# Patient Record
Sex: Female | Born: 1960 | Race: Black or African American | Hispanic: No | Marital: Married | Smoking: Never smoker
Health system: Southern US, Community
[De-identification: ages and names within clinical notes are randomized; demographics above are authoritative.]

## PROBLEM LIST (undated history)

## (undated) DIAGNOSIS — G43909 Migraine, unspecified, not intractable, without status migrainosus: Secondary | ICD-10-CM

## (undated) DIAGNOSIS — K219 Gastro-esophageal reflux disease without esophagitis: Secondary | ICD-10-CM

## (undated) DIAGNOSIS — N200 Calculus of kidney: Secondary | ICD-10-CM

## (undated) DIAGNOSIS — E7800 Pure hypercholesterolemia, unspecified: Secondary | ICD-10-CM

## (undated) DIAGNOSIS — M797 Fibromyalgia: Secondary | ICD-10-CM

## (undated) DIAGNOSIS — M1990 Unspecified osteoarthritis, unspecified site: Secondary | ICD-10-CM

## (undated) DIAGNOSIS — Z8739 Personal history of other diseases of the musculoskeletal system and connective tissue: Secondary | ICD-10-CM

## (undated) HISTORY — PX: TUBAL LIGATION: SHX77

## (undated) HISTORY — PX: ABDOMINAL HYSTERECTOMY: SHX81

## (undated) HISTORY — PX: BREAST CYST EXCISION: SHX579

## (undated) HISTORY — PX: LAPAROSCOPIC GASTRIC SLEEVE RESECTION: SHX5895

## (undated) HISTORY — PX: OTHER SURGICAL HISTORY: SHX169

---

## 2001-03-24 ENCOUNTER — Emergency Department (HOSPITAL_COMMUNITY): Admission: EM | Admit: 2001-03-24 | Discharge: 2001-03-24 | Payer: Self-pay | Admitting: Emergency Medicine

## 2012-06-12 ENCOUNTER — Emergency Department (HOSPITAL_BASED_OUTPATIENT_CLINIC_OR_DEPARTMENT_OTHER)
Admission: EM | Admit: 2012-06-12 | Discharge: 2012-06-12 | Disposition: A | Discharge status: Home / Self Care | Payer: BC Managed Care – PPO | Attending: Emergency Medicine | Admitting: Emergency Medicine

## 2012-06-12 ENCOUNTER — Encounter (HOSPITAL_BASED_OUTPATIENT_CLINIC_OR_DEPARTMENT_OTHER): Payer: Self-pay | Admitting: *Deleted

## 2012-06-12 DIAGNOSIS — M129 Arthropathy, unspecified: Secondary | ICD-10-CM | POA: Insufficient documentation

## 2012-06-12 DIAGNOSIS — K219 Gastro-esophageal reflux disease without esophagitis: Secondary | ICD-10-CM | POA: Insufficient documentation

## 2012-06-12 DIAGNOSIS — L089 Local infection of the skin and subcutaneous tissue, unspecified: Secondary | ICD-10-CM

## 2012-06-12 DIAGNOSIS — R21 Rash and other nonspecific skin eruption: Secondary | ICD-10-CM

## 2012-06-12 DIAGNOSIS — E7800 Pure hypercholesterolemia, unspecified: Secondary | ICD-10-CM | POA: Insufficient documentation

## 2012-06-12 HISTORY — DX: Pure hypercholesterolemia, unspecified: E78.00

## 2012-06-12 HISTORY — DX: Gastro-esophageal reflux disease without esophagitis: K21.9

## 2012-06-12 HISTORY — DX: Unspecified osteoarthritis, unspecified site: M19.90

## 2012-06-12 MED ORDER — SULFAMETHOXAZOLE-TRIMETHOPRIM 800-160 MG PO TABS: 1.0000 | ORAL_TABLET | Freq: Two times a day (BID) | ORAL | Status: AC

## 2012-06-12 MED ORDER — PREDNISONE 10 MG PO TABS: 20.0000 mg | ORAL_TABLET | Freq: Every day | ORAL | Status: DC

## 2012-06-12 MED ORDER — FLUCONAZOLE 150 MG PO TABS: 150.0000 mg | ORAL_TABLET | Freq: Once | ORAL | Status: AC

## 2012-06-12 NOTE — ED Provider Notes (Signed)
History     CSN: [id number]  Arrival date & time 06/12/12  1234   First MD Initiated Contact with Patient 06/12/12 1302      Chief Complaint  Patient presents with  . Pruritis    (Consider location/radiation/quality/duration/timing/severity/associated sxs/prior treatment) Patient is a 51 y.o. female presenting with rash. The history is provided by the patient. No language interpreter was used.  Rash  This is a new problem. The current episode started 2 days ago. The problem has been gradually worsening. The problem is associated with nothing. There has been no fever. The pain is at a severity of 4/10. The pain is moderate. The pain has been constant since onset. Associated symptoms include itching. She has tried nothing for the symptoms. The treatment provided no relief.  Pt complains of a rash to her face.    Past Medical History  Diagnosis Date  . Hypercholesteremia   . Arthritis   . GERD (gastroesophageal reflux disease)     Past Surgical History  Procedure Date  . Abdominal hysterectomy   . Tubal ligation   . Breast cyst excision     No family history on file.  History  Substance Use Topics  . Smoking status: Never Smoker   . Smokeless tobacco: Not on file  . Alcohol Use: No    OB History    Grav Para Term Preterm Abortions TAB SAB Ect Mult Living                  Review of Systems  Skin: Positive for itching and rash.  All other systems reviewed and are negative.    Allergies  Tetracyclines & related  Home Medications   Current Outpatient Rx  Name Route Sig Dispense Refill  . CELECOXIB 100 MG PO CAPS Oral Take 100 mg by mouth 2 (two) times daily.    Marland Kitchen ESOMEPRAZOLE MAGNESIUM 10 MG PO PACK Oral Take 10 mg by mouth daily before breakfast.    . GABAPENTIN 100 MG PO CAPS Oral Take 100 mg by mouth 3 (three) times daily.    Marland Kitchen MONTELUKAST SODIUM 10 MG PO TABS Oral Take 10 mg by mouth at bedtime.    Marland Kitchen SIMVASTATIN 10 MG PO TABS Oral Take 10 mg by mouth at  bedtime.      BP 165/78  Pulse 51  Temp 98.6 F (37 C) (Oral)  Resp 18  SpO2 100%  Physical Exam  Nursing note and vitals reviewed. Constitutional: She is oriented to person, place, and time. She appears well-developed and well-nourished.  HENT:  Head: Normocephalic and atraumatic.  Right Ear: External ear normal.  Left Ear: External ear normal.  Eyes: Conjunctivae are normal. Pupils are equal, round, and reactive to light.  Cardiovascular: Normal rate.   Pulmonary/Chest: Effort normal.  Neurological: She is alert and oriented to person, place, and time. She has normal reflexes.  Skin: Rash noted.       Rash face, some areas yellow draining, other dark scaling,   Psychiatric: She has a normal mood and affect.    ED Course  Procedures (including critical care time)  Labs Reviewed - No data to display No results found.   No diagnosis found.    MDM  I suspect allergy however some areas look infected,  I will treat with bactrim and prednisone.   Pt advised to see Dr. Willa Rough for recheck on Tuesday        Sharon Lamb, [place] 06/12/12 1339

## 2012-06-12 NOTE — ED Notes (Signed)
States that he face has been itching, she does have a rash. Though it was allergies but not improving with allergy medicine.

## 2012-06-12 NOTE — ED Provider Notes (Signed)
History/physical exam/procedure(s) were performed by non-physician practitioner and as supervising physician I was immediately available for consultation/collaboration. I have reviewed all notes and am in agreement with care and plan.   Hilario Quarry, MD 06/12/12 [phone]

## 2013-03-03 ENCOUNTER — Encounter (HOSPITAL_BASED_OUTPATIENT_CLINIC_OR_DEPARTMENT_OTHER): Payer: Self-pay | Admitting: *Deleted

## 2013-03-03 ENCOUNTER — Emergency Department (HOSPITAL_BASED_OUTPATIENT_CLINIC_OR_DEPARTMENT_OTHER)
Admission: EM | Admit: 2013-03-03 | Discharge: 2013-03-03 | Disposition: A | Discharge status: Home / Self Care | Payer: BC Managed Care – PPO | Attending: Emergency Medicine | Admitting: Emergency Medicine

## 2013-03-03 ENCOUNTER — Emergency Department (HOSPITAL_BASED_OUTPATIENT_CLINIC_OR_DEPARTMENT_OTHER): Payer: BC Managed Care – PPO

## 2013-03-03 DIAGNOSIS — M5431 Sciatica, right side: Secondary | ICD-10-CM

## 2013-03-03 DIAGNOSIS — M129 Arthropathy, unspecified: Secondary | ICD-10-CM | POA: Insufficient documentation

## 2013-03-03 DIAGNOSIS — Z79899 Other long term (current) drug therapy: Secondary | ICD-10-CM | POA: Insufficient documentation

## 2013-03-03 DIAGNOSIS — G8929 Other chronic pain: Secondary | ICD-10-CM | POA: Insufficient documentation

## 2013-03-03 DIAGNOSIS — M5430 Sciatica, unspecified side: Secondary | ICD-10-CM | POA: Insufficient documentation

## 2013-03-03 DIAGNOSIS — E7800 Pure hypercholesterolemia, unspecified: Secondary | ICD-10-CM | POA: Insufficient documentation

## 2013-03-03 DIAGNOSIS — K219 Gastro-esophageal reflux disease without esophagitis: Secondary | ICD-10-CM | POA: Insufficient documentation

## 2013-03-03 DIAGNOSIS — M545 Low back pain, unspecified: Secondary | ICD-10-CM | POA: Insufficient documentation

## 2013-03-03 DIAGNOSIS — Z791 Long term (current) use of non-steroidal anti-inflammatories (NSAID): Secondary | ICD-10-CM | POA: Insufficient documentation

## 2013-03-03 MED ORDER — IBUPROFEN 600 MG PO TABS: 600.0000 mg | ORAL_TABLET | Freq: Four times a day (QID) | ORAL | Status: DC | PRN

## 2013-03-03 MED ORDER — IBUPROFEN 800 MG PO TABS
800.0000 mg | ORAL_TABLET | Freq: Once | ORAL | Status: AC
  Administered 2013-03-03: 800 mg via ORAL
  Filled 2013-03-03: qty 1

## 2013-03-03 MED ORDER — DIAZEPAM 5 MG PO TABS: 5.0000 mg | ORAL_TABLET | Freq: Two times a day (BID) | ORAL | Status: DC

## 2013-03-03 MED ORDER — MELOXICAM 15 MG PO TABS
15.0000 mg | ORAL_TABLET | Freq: Once | ORAL | Status: DC
  Filled 2013-03-03: qty 1

## 2013-03-03 MED ORDER — DIAZEPAM 5 MG PO TABS
5.0000 mg | ORAL_TABLET | Freq: Once | ORAL | Status: AC
  Administered 2013-03-03: 5 mg via ORAL
  Filled 2013-03-03: qty 1

## 2013-03-03 NOTE — ED Notes (Signed)
Pt states "why haven't I seen a doctor yet?! Why am I having to wait so long?" comforts offered, warm blanket given.

## 2013-03-03 NOTE — ED Provider Notes (Signed)
Medical screening examination/treatment/procedure(s) were performed by non-physician practitioner and as supervising physician I was immediately available for consultation/collaboration.   [place] Spees W. Mikah Poss, MD 03/03/13 1858 

## 2013-03-03 NOTE — ED Provider Notes (Signed)
History     CSN: [id number]  Arrival date & time 03/03/13  1155   First MD Initiated Contact with Patient 03/03/13 1247      Chief Complaint  Patient presents with  . Hip Pain    (Consider location/radiation/quality/duration/timing/severity/associated sxs/prior treatment) HPI Pt is a 52yo female w/ hx of arthritis in her spine c/o right hip pain that started yesterday. Pain is 7/10-9/10, waxing and waning, sharp in nature radiating down right thigh.  Denies any recent injury or heavy lifting.  Pt takes gabapentin on a daily basis for chronic back pain but has not helped.  Pt has tried Advil and Tylenol w/o relief.    PCP: Dr. Willa Rough.  No orthopedist  Past Medical History  Diagnosis Date  . Hypercholesteremia   . Arthritis   . GERD (gastroesophageal reflux disease)     Past Surgical History  Procedure Laterality Date  . Abdominal hysterectomy    . Tubal ligation    . Breast cyst excision      No family history on file.  History  Substance Use Topics  . Smoking status: Never Smoker   . Smokeless tobacco: Not on file  . Alcohol Use: No    OB History   Grav Para Term Preterm Abortions TAB SAB Ect Mult Living                  Review of Systems  Constitutional: Negative for fever and chills.  Musculoskeletal: Positive for myalgias, back pain and arthralgias.  Skin: Negative.   All other systems reviewed and are negative.    Allergies  Tetracyclines & related and Fentanyl  Home Medications   Current Outpatient Rx  Name  Route  Sig  Dispense  Refill  . celecoxib (CELEBREX) 100 MG capsule   Oral   Take 100 mg by mouth 2 (two) times daily.         . diazepam (VALIUM) 5 MG tablet   Oral   Take 1 tablet (5 mg total) by mouth 2 (two) times daily.   10 tablet   0   . esomeprazole (NEXIUM) 10 MG packet   Oral   Take 10 mg by mouth daily before breakfast.         . gabapentin (NEURONTIN) 100 MG capsule   Oral   Take 100 mg by mouth 3 (three) times  daily.         Marland Kitchen ibuprofen (ADVIL,MOTRIN) 600 MG tablet   Oral   Take 1 tablet (600 mg total) by mouth every 6 (six) hours as needed for pain.   30 tablet   0   . montelukast (SINGULAIR) 10 MG tablet   Oral   Take 10 mg by mouth at bedtime.         . predniSONE (DELTASONE) 10 MG tablet   Oral   Take 2 tablets (20 mg total) by mouth daily.   10 tablet   0   . simvastatin (ZOCOR) 10 MG tablet   Oral   Take 10 mg by mouth at bedtime.           BP 129/83  Pulse 60  Temp(Src) 98.1 F (36.7 C) (Oral)  Resp 20  Ht 5\' 5"  (1.651 m)  Wt 271 lb (122.925 kg)  BMI 45.1 kg/m2  SpO2 100%  Physical Exam  Nursing note and vitals reviewed. Constitutional: She appears well-developed and well-nourished. No distress.  HENT:  Head: Normocephalic and atraumatic.  Eyes: Conjunctivae are normal. No scleral  icterus.  Neck: Normal range of motion.  Cardiovascular: Normal rate, regular rhythm and normal heart sounds.   Pulmonary/Chest: Effort normal and breath sounds normal. No respiratory distress. She has no wheezes. She has no rales. She exhibits no tenderness.  Musculoskeletal: Normal range of motion. She exhibits no tenderness ( no ttp of asis or greater trochanter ).  Neurological: She is alert. She has normal strength. She displays no atrophy and no tremor. No sensory deficit. She exhibits normal muscle tone. She displays no seizure activity. Gait ( antalgic) abnormal.  Skin: Skin is warm and dry. She is not diaphoretic.  Psychiatric: She has a normal mood and affect. Her behavior is normal.    ED Course  Procedures (including critical care time)  Labs Reviewed - No data to display Dg Hip Complete Right  03/03/2013   *RADIOLOGY REPORT*  Clinical Data: Right hip pain and intermittent episodes of the right hip giving way.  No known injuries.  RIGHT HIP - COMPLETE 2+ VIEW  Comparison: None.  Findings: No evidence of acute or subacute fracture or dislocation. Joint space  well-preserved.  No intrinsic osseous abnormalities. No visible joint effusion.  Included AP pelvis demonstrates a normal appearing contralateral left hip.  Sacroiliac joints and symphysis pubis intact.  No fractures elsewhere involving the bony pelvis.  Small spurs arising from the posterior aspect of the acetabuli, right greater than left.  Visualized lower lumbar spine unremarkable.  IMPRESSION: No acute or significant osseous abnormality.   Original Report Authenticated By: Hulan Saas, M.D.     1. Sciatica, right   2. Low back pain       MDM  Right hip pain, sharp, started yesterday. Hx of arthritis in spine. No recent injury. Takes Gabapentin for back daily, took advil and tylenol with minimal relief.  Pain waxes and wanes.  Radiates down halfway down right thigh.  H&P most consistent with sciatic type pain.  Plain films of right hip looking for degenerative changes. Tx in ED: valium and ibuprofen  Plain films Hip: showed no acute sig. Osseous abnormality.  Small spurs from posterior aspect of acetabuli, R>L  Rx: valium and ibuprofen with info packet on back injury prevention and back exercises.  Advised pt to alternate ice and heat as well. F/u w/ Dr Willa Rough, pcp as needed for ongoing back and hip pain.  Vitals: unremarkable. Discharged in stable condition.             Junius Finner, PA-C 03/03/13 1810  Junius Finner, PA-C 03/03/13 [phone]

## 2013-03-03 NOTE — ED Notes (Signed)
Patient states she was standing still yesterday and had a sudden sharp pain in her right hip radiating into right lateral thigh.  States she feels the pain is coming from her back.  Describes pain as sharp and intermittent. States she has arthritis in her spine.  Denies injury.

## 2013-03-19 DIAGNOSIS — G43909 Migraine, unspecified, not intractable, without status migrainosus: Secondary | ICD-10-CM | POA: Insufficient documentation

## 2013-03-19 DIAGNOSIS — G4733 Obstructive sleep apnea (adult) (pediatric): Secondary | ICD-10-CM | POA: Insufficient documentation

## 2013-03-19 DIAGNOSIS — K769 Liver disease, unspecified: Secondary | ICD-10-CM | POA: Insufficient documentation

## 2013-03-19 DIAGNOSIS — J309 Allergic rhinitis, unspecified: Secondary | ICD-10-CM | POA: Insufficient documentation

## 2013-03-19 DIAGNOSIS — M159 Polyosteoarthritis, unspecified: Secondary | ICD-10-CM | POA: Insufficient documentation

## 2013-07-19 DIAGNOSIS — I079 Rheumatic tricuspid valve disease, unspecified: Secondary | ICD-10-CM | POA: Insufficient documentation

## 2013-07-22 DIAGNOSIS — I059 Rheumatic mitral valve disease, unspecified: Secondary | ICD-10-CM | POA: Insufficient documentation

## 2013-07-22 DIAGNOSIS — I517 Cardiomegaly: Secondary | ICD-10-CM | POA: Insufficient documentation

## 2013-08-22 ENCOUNTER — Encounter (HOSPITAL_BASED_OUTPATIENT_CLINIC_OR_DEPARTMENT_OTHER): Payer: Self-pay | Admitting: Emergency Medicine

## 2013-08-22 ENCOUNTER — Emergency Department (HOSPITAL_BASED_OUTPATIENT_CLINIC_OR_DEPARTMENT_OTHER)
Admission: EM | Admit: 2013-08-22 | Discharge: 2013-08-22 | Disposition: A | Discharge status: Home / Self Care | Payer: BC Managed Care – PPO | Attending: Emergency Medicine | Admitting: Emergency Medicine

## 2013-08-22 DIAGNOSIS — M129 Arthropathy, unspecified: Secondary | ICD-10-CM | POA: Insufficient documentation

## 2013-08-22 DIAGNOSIS — K219 Gastro-esophageal reflux disease without esophagitis: Secondary | ICD-10-CM | POA: Insufficient documentation

## 2013-08-22 DIAGNOSIS — M545 Low back pain, unspecified: Secondary | ICD-10-CM | POA: Insufficient documentation

## 2013-08-22 DIAGNOSIS — Z79899 Other long term (current) drug therapy: Secondary | ICD-10-CM | POA: Insufficient documentation

## 2013-08-22 DIAGNOSIS — IMO0002 Reserved for concepts with insufficient information to code with codable children: Secondary | ICD-10-CM | POA: Insufficient documentation

## 2013-08-22 DIAGNOSIS — E7800 Pure hypercholesterolemia, unspecified: Secondary | ICD-10-CM | POA: Insufficient documentation

## 2013-08-22 DIAGNOSIS — M549 Dorsalgia, unspecified: Secondary | ICD-10-CM

## 2013-08-22 MED ORDER — CYCLOBENZAPRINE HCL 10 MG PO TABS: 10.0000 mg | ORAL_TABLET | Freq: Two times a day (BID) | ORAL | Status: DC | PRN

## 2013-08-22 MED ORDER — KETOROLAC TROMETHAMINE 30 MG/ML IJ SOLN
60.0000 mg | Freq: Once | INTRAMUSCULAR | Status: AC
  Administered 2013-08-22: 60 mg via INTRAMUSCULAR

## 2013-08-22 MED ORDER — TRAMADOL HCL 50 MG PO TABS: 50.0000 mg | ORAL_TABLET | Freq: Four times a day (QID) | ORAL | Status: DC | PRN

## 2013-08-22 NOTE — ED Notes (Signed)
Back spasms x 2 days. Hx of arthritis in her back but this is not the same pain. No known injury.

## 2013-08-22 NOTE — ED Provider Notes (Signed)
CSN: [id number]     Arrival date & time 08/22/13  1206 History   First MD Initiated Contact with Patient 08/22/13 1229     Chief Complaint  Patient presents with  . Back Pain   HPI Patient has had 2 days of lower back pain.  Pain suddenly got worse this morning and is exacerbated with movement.  Patient's had no bowel or bladder dysfunction.  She denies any numbness.  Patient denies radicular pain down the leg.  Patient has had history of sciatica in the past.  Patient denies any fever or chills. Past Medical History  Diagnosis Date  . Hypercholesteremia   . Arthritis   . GERD (gastroesophageal reflux disease)    Past Surgical History  Procedure Laterality Date  . Abdominal hysterectomy    . Tubal ligation    . Breast cyst excision     No family history on file. History  Substance Use Topics  . Smoking status: Never Smoker   . Smokeless tobacco: Not on file  . Alcohol Use: No   OB History   Grav Para Term Preterm Abortions TAB SAB Ect Mult Living                 Review of Systems All other systems reviewed and are negative Allergies  Tetracyclines & related and Fentanyl  Home Medications   Current Outpatient Rx  Name  Route  Sig  Dispense  Refill  . celecoxib (CELEBREX) 100 MG capsule   Oral   Take 100 mg by mouth 2 (two) times daily.         . cyclobenzaprine (FLEXERIL) 10 MG tablet   Oral   Take 1 tablet (10 mg total) by mouth 2 (two) times daily as needed for muscle spasms.   20 tablet   0   . diazepam (VALIUM) 5 MG tablet   Oral   Take 1 tablet (5 mg total) by mouth 2 (two) times daily.   10 tablet   0   . esomeprazole (NEXIUM) 10 MG packet   Oral   Take 10 mg by mouth daily before breakfast.         . gabapentin (NEURONTIN) 100 MG capsule   Oral   Take 100 mg by mouth 3 (three) times daily.         Marland Kitchen ibuprofen (ADVIL,MOTRIN) 600 MG tablet   Oral   Take 1 tablet (600 mg total) by mouth every 6 (six) hours as needed for pain.   30 tablet   0   . montelukast (SINGULAIR) 10 MG tablet   Oral   Take 10 mg by mouth at bedtime.         . predniSONE (DELTASONE) 10 MG tablet   Oral   Take 2 tablets (20 mg total) by mouth daily.   10 tablet   0   . simvastatin (ZOCOR) 10 MG tablet   Oral   Take 10 mg by mouth at bedtime.         . traMADol (ULTRAM) 50 MG tablet   Oral   Take 1 tablet (50 mg total) by mouth every 6 (six) hours as needed.   15 tablet   0    BP 128/71  Pulse 66  Temp(Src) 99 F (37.2 C) (Oral)  Resp 20  SpO2 100% Physical Exam  Nursing note and vitals reviewed. Constitutional: She is oriented to person, place, and time. She appears well-developed and well-nourished. No distress.  HENT:  Head: Normocephalic and  atraumatic.  Eyes: Pupils are equal, round, and reactive to light.  Neck: Normal range of motion.  Cardiovascular: Normal rate and intact distal pulses.   Pulmonary/Chest: No respiratory distress.  Abdominal: Normal appearance. She exhibits no distension.  Musculoskeletal:       Back:  Neurological: She is alert and oriented to person, place, and time. No cranial nerve deficit.  Skin: Skin is warm and dry. No rash noted.  Psychiatric: She has a normal mood and affect. Her behavior is normal.    ED Course  Procedures (including critical care time)  Medications  ketorolac (TORADOL) 30 MG/ML injection 60 mg (60 mg Intramuscular Given 08/22/13 1238)    Labs Review Labs Reviewed - No data to display Imaging Review No results found.    MDM   1. Back pain     Symptoms and exam consistent with musculoskeletal back pain.  We'll treat accordingly with discussion a red flags to return to emergency department.     Nelia Shi, MD 08/22/13 [phone]

## 2013-11-30 DIAGNOSIS — E7800 Pure hypercholesterolemia, unspecified: Secondary | ICD-10-CM | POA: Insufficient documentation

## 2013-11-30 DIAGNOSIS — E668 Other obesity: Secondary | ICD-10-CM | POA: Insufficient documentation

## 2014-01-03 DIAGNOSIS — K219 Gastro-esophageal reflux disease without esophagitis: Secondary | ICD-10-CM | POA: Insufficient documentation

## 2014-01-03 DIAGNOSIS — M5030 Other cervical disc degeneration, unspecified cervical region: Secondary | ICD-10-CM | POA: Insufficient documentation

## 2014-01-24 ENCOUNTER — Emergency Department (HOSPITAL_BASED_OUTPATIENT_CLINIC_OR_DEPARTMENT_OTHER)
Admission: EM | Admit: 2014-01-24 | Discharge: 2014-01-24 | Disposition: A | Discharge status: Home / Self Care | Payer: BC Managed Care – PPO | Attending: Emergency Medicine | Admitting: Emergency Medicine

## 2014-01-24 ENCOUNTER — Encounter (HOSPITAL_BASED_OUTPATIENT_CLINIC_OR_DEPARTMENT_OTHER): Payer: Self-pay | Admitting: Emergency Medicine

## 2014-01-24 DIAGNOSIS — Z791 Long term (current) use of non-steroidal anti-inflammatories (NSAID): Secondary | ICD-10-CM | POA: Insufficient documentation

## 2014-01-24 DIAGNOSIS — M129 Arthropathy, unspecified: Secondary | ICD-10-CM | POA: Insufficient documentation

## 2014-01-24 DIAGNOSIS — Z79899 Other long term (current) drug therapy: Secondary | ICD-10-CM | POA: Insufficient documentation

## 2014-01-24 DIAGNOSIS — H9209 Otalgia, unspecified ear: Secondary | ICD-10-CM | POA: Insufficient documentation

## 2014-01-24 DIAGNOSIS — E7800 Pure hypercholesterolemia, unspecified: Secondary | ICD-10-CM | POA: Insufficient documentation

## 2014-01-24 DIAGNOSIS — K219 Gastro-esophageal reflux disease without esophagitis: Secondary | ICD-10-CM | POA: Insufficient documentation

## 2014-01-24 DIAGNOSIS — J029 Acute pharyngitis, unspecified: Secondary | ICD-10-CM | POA: Insufficient documentation

## 2014-01-24 MED ORDER — AMOXICILLIN 500 MG PO CAPS: 500.0000 mg | ORAL_CAPSULE | Freq: Three times a day (TID) | ORAL | Status: AC

## 2014-01-24 MED ORDER — FLUCONAZOLE 150 MG PO TABS: 150.0000 mg | ORAL_TABLET | Freq: Every day | ORAL | Status: DC

## 2014-01-24 NOTE — Discharge Instructions (Signed)
Sore Throat A sore throat is pain, burning, irritation, or scratchiness of the throat. There is often pain or tenderness when swallowing or talking. A sore throat may be accompanied by other symptoms, such as coughing, sneezing, fever, and swollen neck glands. A sore throat is often the first sign of another sickness, such as a cold, flu, strep throat, or mononucleosis (commonly known as mono). Most sore throats go away without medical treatment. CAUSES  The most common causes of a sore throat include:  A viral infection, such as a cold, flu, or mono.  A bacterial infection, such as strep throat, tonsillitis, or whooping cough.  Seasonal allergies.  Dryness in the air.  Irritants, such as smoke or pollution.  Gastroesophageal reflux disease (GERD). HOME CARE INSTRUCTIONS   Only take over-the-counter medicines as directed by your caregiver.  Drink enough fluids to keep your urine clear or pale yellow.  Rest as needed.  Try using throat sprays, lozenges, or sucking on hard candy to ease any pain (if older than 4 years or as directed).  Sip warm liquids, such as broth, herbal tea, or warm water with honey to relieve pain temporarily. You may also eat or drink cold or frozen liquids such as frozen ice pops.  Gargle with salt water (mix 1 tsp salt with 8 oz of water).  Do not smoke and avoid secondhand smoke.  Put a cool-mist humidifier in your bedroom at night to moisten the air. You can also turn on a hot shower and sit in the bathroom with the door closed for 5 10 minutes. SEEK IMMEDIATE MEDICAL CARE IF:  You have difficulty breathing.  You are unable to swallow fluids, soft foods, or your saliva.  You have increased swelling in the throat.  Your sore throat does not get better in 7 days.  You have nausea and vomiting.  You have a fever or persistent symptoms for more than 2 3 days.  You have a fever and your symptoms suddenly get worse. MAKE SURE YOU:   Understand  these instructions.  Will watch your condition.  Will get help right away if you are not doing well or get worse. Document Released: 11/06/2004 Document Revised: 09/15/2012 Document Reviewed: 06/06/2012 [employer] Patient Information 2014 [employer].  

## 2014-01-24 NOTE — ED Provider Notes (Signed)
CSN: [id number]     Arrival date & time 01/24/14  1440 History   First MD Initiated Contact with Patient 01/24/14 [phone]     Chief Complaint  Patient presents with  . Sore Throat     (Consider location/radiation/quality/duration/timing/severity/associated sxs/prior Treatment) Patient is a 53 y.o. female presenting with pharyngitis. No language interpreter was used.  Sore Throat This is a new problem. The current episode started today. The problem occurs constantly. The problem has been unchanged. Associated symptoms include a sore throat. Pertinent negatives include no fever. Nothing aggravates the symptoms. She has tried nothing for the symptoms. The treatment provided mild relief.    Past Medical History  Diagnosis Date  . Hypercholesteremia   . Arthritis   . GERD (gastroesophageal reflux disease)    Past Surgical History  Procedure Laterality Date  . Abdominal hysterectomy    . Tubal ligation    . Breast cyst excision     History reviewed. No pertinent family history. History  Substance Use Topics  . Smoking status: Never Smoker   . Smokeless tobacco: Not on file  . Alcohol Use: No   OB History   Grav Para Term Preterm Abortions TAB SAB Ect Mult Living                 Review of Systems  Constitutional: Negative for fever.  HENT: Positive for ear pain and sore throat.   All other systems reviewed and are negative.     Allergies  Tetracyclines & related and Fentanyl  Home Medications   Prior to Admission medications   Medication Sig Start Date End Date Taking? Authorizing Provider  celecoxib (CELEBREX) 100 MG capsule Take 100 mg by mouth 2 (two) times daily.    Historical Provider, MD  cyclobenzaprine (FLEXERIL) 10 MG tablet Take 1 tablet (10 mg total) by mouth 2 (two) times daily as needed for muscle spasms. 08/22/13   Nelia Shiobert L Beaton, MD  diazepam (VALIUM) 5 MG tablet Take 1 tablet (5 mg total) by mouth 2 (two) times daily. 03/03/13   Junius FinnerErin O'Malley, PA-C   esomeprazole (NEXIUM) 10 MG packet Take 10 mg by mouth daily before breakfast.    Historical Provider, MD  gabapentin (NEURONTIN) 100 MG capsule Take 100 mg by mouth 3 (three) times daily.    Historical Provider, MD  ibuprofen (ADVIL,MOTRIN) 600 MG tablet Take 1 tablet (600 mg total) by mouth every 6 (six) hours as needed for pain. 03/03/13   Junius FinnerErin O'Malley, PA-C  montelukast (SINGULAIR) 10 MG tablet Take 10 mg by mouth at bedtime.    Historical Provider, MD  simvastatin (ZOCOR) 10 MG tablet Take 10 mg by mouth at bedtime.    Historical Provider, MD  traMADol (ULTRAM) 50 MG tablet Take 1 tablet (50 mg total) by mouth every 6 (six) hours as needed. 08/22/13   Nelia Shiobert L Beaton, MD   BP 130/69  Pulse 56  Temp(Src) 98.3 F (36.8 C) (Oral)  Resp 18  Ht 5\' 4"  (1.626 m)  Wt 290 lb (131.543 kg)  BMI 49.75 kg/m2  SpO2 98% Physical Exam  Nursing note and vitals reviewed. Constitutional: She is oriented to person, place, and time. She appears well-developed and well-nourished.  HENT:  Head: Normocephalic and atraumatic.  Right Ear: External ear normal.  Left Ear: External ear normal.  Nose: Nose normal.  Eyes: Conjunctivae and EOM are normal. Pupils are equal, round, and reactive to light.  Neck: Normal range of motion.  Pulmonary/Chest: Effort normal.  Abdominal:  Soft. She exhibits no distension.  Musculoskeletal: Normal range of motion.  Neurological: She is alert and oriented to person, place, and time.  Skin: Skin is warm.  Psychiatric: She has a normal mood and affect.    ED Course  Procedures (including critical care time) Labs Review Labs Reviewed - No data to display  Imaging Review No results found.   EKG Interpretation None      MDM  Pthas tenderness in lower throat,   Tm is clear   Final diagnoses:  None   amoxicillian Diflucan Schedule to see ENT if pain persist  Dr. Margot Chimeseoh    Leslie K Sofia, PA-C 01/24/14 1534

## 2014-01-24 NOTE — ED Notes (Signed)
Pt reports sore throat and (R) ear pain since Sunday.  Denies fever

## 2014-01-25 NOTE — ED Provider Notes (Addendum)
Medical screening examination/treatment/procedure(s) were performed by non-physician practitioner and as supervising physician I was immediately available for consultation/collaboration.   EKG Interpretation None         Shanna CiscoMegan E Docherty, MD 01/25/14 1203  Shanna CiscoMegan E Docherty, MD 02/01/14 2103

## 2014-02-08 DIAGNOSIS — K648 Other hemorrhoids: Secondary | ICD-10-CM | POA: Insufficient documentation

## 2014-03-31 DIAGNOSIS — K7581 Nonalcoholic steatohepatitis (NASH): Secondary | ICD-10-CM | POA: Insufficient documentation

## 2014-03-31 DIAGNOSIS — N200 Calculus of kidney: Secondary | ICD-10-CM | POA: Insufficient documentation

## 2014-06-22 ENCOUNTER — Emergency Department (HOSPITAL_BASED_OUTPATIENT_CLINIC_OR_DEPARTMENT_OTHER): Payer: BC Managed Care – PPO

## 2014-06-22 ENCOUNTER — Emergency Department (HOSPITAL_BASED_OUTPATIENT_CLINIC_OR_DEPARTMENT_OTHER)
Admission: EM | Admit: 2014-06-22 | Discharge: 2014-06-22 | Disposition: A | Discharge status: Home / Self Care | Payer: BC Managed Care – PPO | Attending: Emergency Medicine | Admitting: Emergency Medicine

## 2014-06-22 ENCOUNTER — Encounter (HOSPITAL_BASED_OUTPATIENT_CLINIC_OR_DEPARTMENT_OTHER): Payer: Self-pay | Admitting: Emergency Medicine

## 2014-06-22 DIAGNOSIS — E7800 Pure hypercholesterolemia, unspecified: Secondary | ICD-10-CM | POA: Insufficient documentation

## 2014-06-22 DIAGNOSIS — Z791 Long term (current) use of non-steroidal anti-inflammatories (NSAID): Secondary | ICD-10-CM | POA: Insufficient documentation

## 2014-06-22 DIAGNOSIS — Z9889 Other specified postprocedural states: Secondary | ICD-10-CM | POA: Insufficient documentation

## 2014-06-22 DIAGNOSIS — IMO0001 Reserved for inherently not codable concepts without codable children: Secondary | ICD-10-CM | POA: Insufficient documentation

## 2014-06-22 DIAGNOSIS — R51 Headache: Secondary | ICD-10-CM | POA: Diagnosis not present

## 2014-06-22 DIAGNOSIS — M129 Arthropathy, unspecified: Secondary | ICD-10-CM | POA: Insufficient documentation

## 2014-06-22 DIAGNOSIS — R6883 Chills (without fever): Secondary | ICD-10-CM | POA: Insufficient documentation

## 2014-06-22 DIAGNOSIS — Z79899 Other long term (current) drug therapy: Secondary | ICD-10-CM | POA: Insufficient documentation

## 2014-06-22 DIAGNOSIS — J159 Unspecified bacterial pneumonia: Secondary | ICD-10-CM | POA: Diagnosis not present

## 2014-06-22 DIAGNOSIS — K219 Gastro-esophageal reflux disease without esophagitis: Secondary | ICD-10-CM | POA: Insufficient documentation

## 2014-06-22 DIAGNOSIS — Z792 Long term (current) use of antibiotics: Secondary | ICD-10-CM | POA: Insufficient documentation

## 2014-06-22 DIAGNOSIS — J189 Pneumonia, unspecified organism: Secondary | ICD-10-CM

## 2014-06-22 DIAGNOSIS — J029 Acute pharyngitis, unspecified: Secondary | ICD-10-CM | POA: Insufficient documentation

## 2014-06-22 DIAGNOSIS — J069 Acute upper respiratory infection, unspecified: Secondary | ICD-10-CM | POA: Diagnosis not present

## 2014-06-22 DIAGNOSIS — R110 Nausea: Secondary | ICD-10-CM | POA: Insufficient documentation

## 2014-06-22 LAB — RAPID STREP SCREEN (MED CTR MEBANE ONLY): STREPTOCOCCUS, GROUP A SCREEN (DIRECT): NEGATIVE

## 2014-06-22 MED ORDER — FLUCONAZOLE 150 MG PO TABS: 150.0000 mg | ORAL_TABLET | Freq: Every day | ORAL | Status: DC

## 2014-06-22 MED ORDER — AZITHROMYCIN 250 MG PO TABS: 250.0000 mg | ORAL_TABLET | Freq: Every day | ORAL | Status: DC

## 2014-06-22 MED ORDER — DM-GUAIFENESIN ER 30-600 MG PO TB12: 1.0000 | ORAL_TABLET | Freq: Two times a day (BID) | ORAL | Status: DC

## 2014-06-22 NOTE — ED Provider Notes (Addendum)
CSN: [id number]     Arrival date & time 06/22/14  1191 History   First MD Initiated Contact with Patient 06/22/14 0957     Chief Complaint  Patient presents with  . Chills     (Consider location/radiation/quality/duration/timing/severity/associated sxs/prior Treatment) The history is provided by the patient.   patient with four-day history of cough occasionally productive congestion bodyaches headache chills no fever. Patient using over-the-counter cold medicines without significant relief. 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 Patient with past history of strep throat does have a sore throat with this as well.  Past Medical History  Diagnosis Date  . Hypercholesteremia   . Arthritis   . GERD (gastroesophageal reflux disease)    Past Surgical History  Procedure Laterality Date  . Abdominal hysterectomy    . Tubal ligation    . Breast cyst excision     No family history on file. History  Substance Use Topics  . Smoking status: Never Smoker   . Smokeless tobacco: Not on file  . Alcohol Use: No   OB History   Grav Para Term Preterm Abortions TAB SAB Ect Mult Living                 Review of Systems  Constitutional: Positive for chills. Negative for fever.  HENT: Positive for congestion and sore throat. Negative for ear pain.   Eyes: Negative for redness.  Respiratory: Positive for cough. Negative for shortness of breath.   Cardiovascular: Negative for chest pain.  Gastrointestinal: Positive for nausea. Negative for vomiting, abdominal pain and diarrhea.  Genitourinary: Negative for dysuria.  Musculoskeletal: Positive for myalgias.  Skin: Negative for rash.  Neurological: Positive for headaches.  Hematological: Does not bruise/bleed easily.  Psychiatric/Behavioral: Negative for confusion.      Allergies  Tetracyclines & related and Fentanyl  Home Medications   Prior to Admission medications   Medication Sig Start Date End  Date Taking? Authorizing Provider  azithromycin (ZITHROMAX) 250 MG tablet Take 1 tablet (250 mg total) by mouth daily. Take first 2 tablets together, then 1 every day until finished. 06/22/14   Vanetta Mulders, MD  celecoxib (CELEBREX) 100 MG capsule Take 100 mg by mouth 2 (two) times daily.    Historical Provider, MD  cyclobenzaprine (FLEXERIL) 10 MG tablet Take 1 tablet (10 mg total) by mouth 2 (two) times daily as needed for muscle spasms. 08/22/13   Nelia Shi, MD  dextromethorphan-guaiFENesin [facility] DM) 30-600 MG per 12 hr tablet Take 1 tablet by mouth 2 (two) times daily. 06/22/14   Vanetta Mulders, MD  diazepam (VALIUM) 5 MG tablet Take 1 tablet (5 mg total) by mouth 2 (two) times daily. 03/03/13   Junius Finner, PA-C  esomeprazole (NEXIUM) 10 MG packet Take 10 mg by mouth daily before breakfast.    Historical Provider, MD  fluconazole (DIFLUCAN) 150 MG tablet Take 1 tablet (150 mg total) by mouth daily. 01/24/14   Elson Areas, PA-C  fluconazole (DIFLUCAN) 150 MG tablet Take 1 tablet (150 mg total) by mouth daily. 06/22/14   Vanetta Mulders, MD  gabapentin (NEURONTIN) 100 MG capsule Take 100 mg by mouth 3 (three) times daily.    Historical Provider, MD  ibuprofen (ADVIL,MOTRIN) 600 MG tablet Take 1 tablet (600 mg total) by mouth every 6 (six) hours as needed for pain. 03/03/13   Junius Finner, PA-C  montelukast (SINGULAIR) 10 MG tablet Take 10 mg by mouth at bedtime.  Historical Provider, MD  simvastatin (ZOCOR) 10 MG tablet Take 10 mg by mouth at bedtime.    Historical Provider, MD  traMADol (ULTRAM) 50 MG tablet Take 1 tablet (50 mg total) by mouth every 6 (six) hours as needed. 08/22/13   Nelia Shi, MD   BP 153/79  Pulse 56  Temp(Src) 98.4 F (36.9 C) (Oral)  Resp 20  Ht  (1.651 m)  Wt 285 lb (129.275 kg)  BMI 47.43 kg/m2  SpO2 100% Physical Exam  Nursing note and vitals reviewed. Constitutional: She is oriented to person, place, and time. She appears  well-developed and well-nourished.  HENT:  Head: Normocephalic and atraumatic.  Mouth/Throat: Oropharynx is clear and moist. No oropharyngeal exudate.  Posterior pharynx with erythema no exudate no tonsillar swelling.  Eyes: Conjunctivae and EOM are normal. Pupils are equal, round, and reactive to light.  Neck: Normal range of motion. Neck supple.  Cardiovascular: Normal rate, regular rhythm and normal heart sounds.   No murmur heard. Pulmonary/Chest: Effort normal and breath sounds normal. No respiratory distress. She has no wheezes. She has no rales.  Bilateral rhonchi.  Abdominal: Soft. Bowel sounds are normal. There is no tenderness.  Musculoskeletal: Normal range of motion.  Lymphadenopathy:    She has no cervical adenopathy.  Neurological: She is alert and oriented to person, place, and time. No cranial nerve deficit. She exhibits normal muscle tone. Coordination normal.  Skin: Skin is warm. No rash noted.    ED Course  Procedures (including critical care time) Labs Review Labs Reviewed  RAPID STREP SCREEN  CULTURE, GROUP A STREP    Imaging Review Dg Chest 2 View  06/22/2014   CLINICAL DATA:  Cough fever and congestion.  EXAM: CHEST  2 VIEW  COMPARISON:  CT chest 07/20/2013.  FINDINGS: Low lung volumes exaggerate the heart size and interstitium. Mild bibasilar airspace disease is worse on the left. The visualized soft tissues and bony thorax are unremarkable.  IMPRESSION: 1. Low lung volumes with mild bibasilar airspace disease. While this likely reflects atelectasis, infection is also considered.   Electronically Signed   By: Gennette Pac M.D.   On: 06/22/2014 10:45     EKG Interpretation None      MDM   Final diagnoses:  URI (upper respiratory infection)  CAP (community acquired pneumonia)    Chest x-ray raises concern for developing community-acquired pneumonia. Patient's had no recent hospitalizations. Will treat with Zithromax as an outpatient. We'll provide  Mucinex DM for the coughing and mucus. Also note provided to be out of work for the next few days. Patient has trouble with developing vaginal yeast infections and therefore we'll begin a prescription for Diflucan as well. Patient currently nontoxic no acute distress.  Strep screen was negative throat culture pending but most likely not consistent with strep pharyngitis. However if present the Zithromax is appropriate therapy.    Vanetta Mulders, MD 06/22/14 1106  Vanetta Mulders, MD 06/22/14 1106

## 2014-06-22 NOTE — ED Notes (Signed)
Developed cough, congestion, sneezing, neck pain, headache and chills that started 4 days ago.  No relief after OTC medications. No sick contacts.

## 2014-06-22 NOTE — Discharge Instructions (Signed)
Chest x-ray raises concern for early pneumonia. Take the antibiotic as directed. Use the Mucinex DM for the cough and the phlegm. As per your request Diflucan provided to help prevent a vaginal yeast infection. Work note provided for the next 2 days. Return for any new or worse symptoms. If not improving by Monday follow up with your Dr.

## 2014-06-24 LAB — CULTURE, GROUP A STREP

## 2014-09-12 DIAGNOSIS — I272 Pulmonary hypertension, unspecified: Secondary | ICD-10-CM | POA: Insufficient documentation

## 2014-10-13 HISTORY — PX: ACHILLES TENDON REPAIR: SUR1153

## 2014-11-09 ENCOUNTER — Emergency Department (HOSPITAL_BASED_OUTPATIENT_CLINIC_OR_DEPARTMENT_OTHER): Payer: BC Managed Care – PPO

## 2014-11-09 ENCOUNTER — Emergency Department (HOSPITAL_BASED_OUTPATIENT_CLINIC_OR_DEPARTMENT_OTHER)
Admission: EM | Admit: 2014-11-09 | Discharge: 2014-11-09 | Disposition: A | Discharge status: Home / Self Care | Payer: BC Managed Care – PPO | Attending: Emergency Medicine | Admitting: Emergency Medicine

## 2014-11-09 ENCOUNTER — Encounter (HOSPITAL_BASED_OUTPATIENT_CLINIC_OR_DEPARTMENT_OTHER): Payer: Self-pay | Admitting: *Deleted

## 2014-11-09 DIAGNOSIS — Z791 Long term (current) use of non-steroidal anti-inflammatories (NSAID): Secondary | ICD-10-CM | POA: Diagnosis not present

## 2014-11-09 DIAGNOSIS — J069 Acute upper respiratory infection, unspecified: Secondary | ICD-10-CM | POA: Diagnosis not present

## 2014-11-09 DIAGNOSIS — R05 Cough: Secondary | ICD-10-CM | POA: Diagnosis present

## 2014-11-09 DIAGNOSIS — M1990 Unspecified osteoarthritis, unspecified site: Secondary | ICD-10-CM | POA: Diagnosis not present

## 2014-11-09 DIAGNOSIS — Z792 Long term (current) use of antibiotics: Secondary | ICD-10-CM | POA: Insufficient documentation

## 2014-11-09 DIAGNOSIS — E785 Hyperlipidemia, unspecified: Secondary | ICD-10-CM | POA: Insufficient documentation

## 2014-11-09 DIAGNOSIS — Z79899 Other long term (current) drug therapy: Secondary | ICD-10-CM | POA: Insufficient documentation

## 2014-11-09 DIAGNOSIS — K219 Gastro-esophageal reflux disease without esophagitis: Secondary | ICD-10-CM | POA: Insufficient documentation

## 2014-11-09 MED ORDER — AZITHROMYCIN 250 MG PO TABS: 250.0000 mg | ORAL_TABLET | Freq: Every day | ORAL | Status: DC

## 2014-11-09 MED ORDER — FLUCONAZOLE 150 MG PO TABS: 150.0000 mg | ORAL_TABLET | Freq: Once | ORAL | Status: DC

## 2014-11-09 MED ORDER — FLUTICASONE PROPIONATE 50 MCG/ACT NA SUSP: 2.0000 | Freq: Every day | NASAL | Status: AC

## 2014-11-09 MED ORDER — BENZONATATE 100 MG PO CAPS: 100.0000 mg | ORAL_CAPSULE | Freq: Three times a day (TID) | ORAL | Status: DC | PRN

## 2014-11-09 NOTE — ED Notes (Signed)
Patient transported to X-ray 

## 2014-11-09 NOTE — ED Notes (Signed)
Pt c/o cough x3 weeks that is intermittently productive of green or brown sputum. Pt also c/o sore throat.

## 2014-11-09 NOTE — Discharge Instructions (Signed)
Upper Respiratory Infection, Adult An upper respiratory infection (URI) is also sometimes known as the common cold. The upper respiratory tract includes the nose, sinuses, throat, trachea, and bronchi. Bronchi are the airways leading to the lungs. Most people improve within 1 week, but symptoms can last up to 2 weeks. A residual cough may last even longer.  CAUSES Many different viruses can infect the tissues lining the upper respiratory tract. The tissues become irritated and inflamed and often become very moist. Mucus production is also common. A cold is contagious. You can easily spread the virus to others by oral contact. This includes kissing, sharing a glass, coughing, or sneezing. Touching your mouth or nose and then touching a surface, which is then touched by another person, can also spread the virus. SYMPTOMS  Symptoms typically develop 1 to 3 days after you come in contact with a cold virus. Symptoms vary from person to person. They may include:  Runny nose.  Sneezing.  Nasal congestion.  Sinus irritation.  Sore throat.  Loss of voice (laryngitis).  Cough.  Fatigue.  Muscle aches.  Loss of appetite.  Headache.  Low-grade fever. DIAGNOSIS  You might diagnose your own cold based on familiar symptoms, since most people get a cold 2 to 3 times a year. Your caregiver can confirm this based on your exam. Most importantly, your caregiver can check that your symptoms are not due to another disease such as strep throat, sinusitis, pneumonia, asthma, or epiglottitis. Blood tests, throat tests, and X-rays are not necessary to diagnose a common cold, but they may sometimes be helpful in excluding other more serious diseases. Your caregiver will decide if any further tests are required. RISKS AND COMPLICATIONS  You may be at risk for a more severe case of the common cold if you smoke cigarettes, have chronic heart disease (such as heart failure) or lung disease (such as asthma), or if  you have a weakened immune system. The very young and very old are also at risk for more serious infections. Bacterial sinusitis, middle ear infections, and bacterial pneumonia can complicate the common cold. The common cold can worsen asthma and chronic obstructive pulmonary disease (COPD). Sometimes, these complications can require emergency medical care and may be life-threatening. PREVENTION  The best way to protect against getting a cold is to practice good hygiene. Avoid oral or hand contact with people with cold symptoms. Wash your hands often if contact occurs. There is no clear evidence that vitamin C, vitamin E, echinacea, or exercise reduces the chance of developing a cold. However, it is always recommended to get plenty of rest and practice good nutrition. TREATMENT  Treatment is directed at relieving symptoms. There is no cure. Antibiotics are not effective, because the infection is caused by a virus, not by bacteria. Treatment may include:  Increased fluid intake. Sports drinks offer valuable electrolytes, sugars, and fluids.  Breathing heated mist or steam (vaporizer or shower).  Eating chicken soup or other clear broths, and maintaining good nutrition.  Getting plenty of rest.  Using gargles or lozenges for comfort.  Controlling fevers with ibuprofen or acetaminophen as directed by your caregiver.  Increasing usage of your inhaler if you have asthma. Zinc gel and zinc lozenges, taken in the first 24 hours of the common cold, can shorten the duration and lessen the severity of symptoms. Pain medicines may help with fever, muscle aches, and throat pain. A variety of non-prescription medicines are available to treat congestion and runny nose. Your caregiver   can make recommendations and may suggest nasal or lung inhalers for other symptoms.  HOME CARE INSTRUCTIONS   Only take over-the-counter or prescription medicines for pain, discomfort, or fever as directed by your  caregiver.  Use a warm mist humidifier or inhale steam from a shower to increase air moisture. This may keep secretions moist and make it easier to breathe.  Drink enough water and fluids to keep your urine clear or pale yellow.  Rest as needed.  Return to work when your temperature has returned to normal or as your caregiver advises. You may need to stay home longer to avoid infecting others. You can also use a face mask and careful hand washing to prevent spread of the virus. SEEK MEDICAL CARE IF:   After the first few days, you feel you are getting worse rather than better.  You need your caregiver's advice about medicines to control symptoms.  You develop chills, worsening shortness of breath, or brown or red sputum. These may be signs of pneumonia.  You develop yellow or brown nasal discharge or pain in the face, especially when you bend forward. These may be signs of sinusitis.  You develop a fever, swollen neck glands, pain with swallowing, or white areas in the back of your throat. These may be signs of strep throat. SEEK IMMEDIATE MEDICAL CARE IF:   You have a fever.  You develop severe or persistent headache, ear pain, sinus pain, or chest pain.  You develop wheezing, a prolonged cough, cough up blood, or have a change in your usual mucus (if you have chronic lung disease).  You develop sore muscles or a stiff neck. Document Released: 03/25/2001 Document Revised: 12/22/2011 Document Reviewed: 01/04/2014 [employer] Patient Information 2015 [employer]. This information is not intended to replace advice given to you by your health care provider. Make sure you discuss any questions you have with your health care provider.  

## 2014-11-09 NOTE — ED Provider Notes (Signed)
TIME SEEN: 10:45 AM  CHIEF COMPLAINT: Cough for 3 weeks  HPI: Pt is a 54 y.o. female with history of hyperlipidemia, GERD, arthritis who presents emergency department with productive cough with green sputum for the past 3 weeks. No shortness of breath, wheezing. Has also had a sore throat, nasal congestion. No fever. No sick contacts or recent travel. No lower extreme swelling or pain. A history of PE or DVT.  ROS: See HPI Constitutional: no fever  Eyes: no drainage  ENT:  runny nose   Cardiovascular:  no chest pain  Resp: no SOB  GI: no vomiting GU: no dysuria Integumentary: no rash  Allergy: no hives  Musculoskeletal: no leg swelling  Neurological: no slurred speech ROS otherwise negative  PAST MEDICAL HISTORY/PAST SURGICAL HISTORY:  Past Medical History  Diagnosis Date  . Hypercholesteremia   . Arthritis   . GERD (gastroesophageal reflux disease)     MEDICATIONS:  Prior to Admission medications   Medication Sig Start Date End Date Taking? Authorizing Provider  azithromycin (ZITHROMAX) 250 MG tablet Take 1 tablet (250 mg total) by mouth daily. Take first 2 tablets together, then 1 every day until finished. 06/22/14   Vanetta MuldersScott Zackowski, MD  celecoxib (CELEBREX) 100 MG capsule Take 100 mg by mouth 2 (two) times daily.    Historical Provider, MD  cyclobenzaprine (FLEXERIL) 10 MG tablet Take 1 tablet (10 mg total) by mouth 2 (two) times daily as needed for muscle spasms. 08/22/13   Nelia Shiobert L Beaton, MD  dextromethorphan-guaiFENesin [facility](MUCINEX DM) 30-600 MG per 12 hr tablet Take 1 tablet by mouth 2 (two) times daily. 06/22/14   Vanetta MuldersScott Zackowski, MD  diazepam (VALIUM) 5 MG tablet Take 1 tablet (5 mg total) by mouth 2 (two) times daily. 03/03/13   Junius FinnerErin O'Malley, PA-C  esomeprazole (NEXIUM) 10 MG packet Take 10 mg by mouth daily before breakfast.    Historical Provider, MD  fluconazole (DIFLUCAN) 150 MG tablet Take 1 tablet (150 mg total) by mouth daily. 01/24/14   Elson AreasLeslie K Sofia, PA-C   fluconazole (DIFLUCAN) 150 MG tablet Take 1 tablet (150 mg total) by mouth daily. 06/22/14   Vanetta MuldersScott Zackowski, MD  gabapentin (NEURONTIN) 100 MG capsule Take 100 mg by mouth 3 (three) times daily.    Historical Provider, MD  ibuprofen (ADVIL,MOTRIN) 600 MG tablet Take 1 tablet (600 mg total) by mouth every 6 (six) hours as needed for pain. 03/03/13   Junius FinnerErin O'Malley, PA-C  montelukast (SINGULAIR) 10 MG tablet Take 10 mg by mouth at bedtime.    Historical Provider, MD  simvastatin (ZOCOR) 10 MG tablet Take 10 mg by mouth at bedtime.    Historical Provider, MD  traMADol (ULTRAM) 50 MG tablet Take 1 tablet (50 mg total) by mouth every 6 (six) hours as needed. 08/22/13   Nelia Shiobert L Beaton, MD    ALLERGIES:  Allergies  Allergen Reactions  . Tetracyclines & Related   . Fentanyl Palpitations    Patch    SOCIAL HISTORY:  History  Substance Use Topics  . Smoking status: Never Smoker   . Smokeless tobacco: Not on file  . Alcohol Use: No    FAMILY HISTORY: No family history on file.  EXAM: BP 141/71 mmHg  Pulse 56  Temp(Src) 98.1 F (36.7 C) (Oral)  Resp 22  Ht 5\' 4"  (1.626 m)  Wt 293 lb (132.904 kg)  BMI 50.27 kg/m2  SpO2 100% CONSTITUTIONAL: Alert and oriented and responds appropriately to questions. Well-appearing; well-nourished HEAD: Normocephalic EYES: Conjunctivae  clear, PERRL ENT: normal nose; no rhinorrhea; moist mucous membranes; pharynx without lesions noted; no tonsillar hypertrophy or exudate, no trismus or drooling, no dental abscess, no uvular deviation, no muffled voice, patient does have clear to yellow nasal sinus drainage noted in the posterior oropharynx NECK: Supple, no meningismus, no LAD  CARD: RRR; S1 and S2 appreciated; no murmurs, no clicks, no rubs, no gallops RESP: Normal chest excursion without splinting or tachypnea; breath sounds clear and equal bilaterally; no wheezes, no rhonchi, no rales, no hypoxia rest for distress, speaking full code at this, no  increased work of breathing ABD/GI: Normal bowel sounds; non-distended; soft, non-tender, no rebound, no guarding BACK:  The back appears normal and is non-tender to palpation, there is no CVA tenderness EXT: Normal ROM in all joints; non-tender to palpation; no edema; normal capillary refill; no cyanosis; no calf tenderness or swelling SKIN: Normal color for age and race; warm NEURO: Moves all extremities equally PSYCH: The patient's mood and manner are appropriate. Grooming and personal hygiene are appropriate.  MEDICAL DECISION MAKING: Patient here with nasal congestion, postnasal drip likely causing her cough but given she has had productive cough with green sputum will discharge home on azithromycin to treat for possible bronchitis, treated acquired pneumonia that is unseen on chest x-ray today. No sign of edema or pneumothorax. She is hemodynamically stable and in no respiratory distress and denies shortness of breath. We'll discharge as well with Flonase nasal spray. Discussed return precautions. She verbalized understanding and is comfortable with plan.       Layla Maw Ward, DO 11/09/14 1059

## 2014-11-14 ENCOUNTER — Emergency Department (HOSPITAL_BASED_OUTPATIENT_CLINIC_OR_DEPARTMENT_OTHER)
Admission: EM | Admit: 2014-11-14 | Discharge: 2014-11-14 | Disposition: A | Discharge status: Home / Self Care | Payer: Worker's Compensation | Attending: Emergency Medicine | Admitting: Emergency Medicine

## 2014-11-14 ENCOUNTER — Emergency Department (HOSPITAL_BASED_OUTPATIENT_CLINIC_OR_DEPARTMENT_OTHER): Payer: Worker's Compensation

## 2014-11-14 ENCOUNTER — Encounter (HOSPITAL_BASED_OUTPATIENT_CLINIC_OR_DEPARTMENT_OTHER): Payer: Self-pay | Admitting: Emergency Medicine

## 2014-11-14 DIAGNOSIS — M1990 Unspecified osteoarthritis, unspecified site: Secondary | ICD-10-CM | POA: Diagnosis not present

## 2014-11-14 DIAGNOSIS — Y9389 Activity, other specified: Secondary | ICD-10-CM | POA: Diagnosis not present

## 2014-11-14 DIAGNOSIS — Z791 Long term (current) use of non-steroidal anti-inflammatories (NSAID): Secondary | ICD-10-CM | POA: Insufficient documentation

## 2014-11-14 DIAGNOSIS — K219 Gastro-esophageal reflux disease without esophagitis: Secondary | ICD-10-CM | POA: Insufficient documentation

## 2014-11-14 DIAGNOSIS — Y9289 Other specified places as the place of occurrence of the external cause: Secondary | ICD-10-CM | POA: Insufficient documentation

## 2014-11-14 DIAGNOSIS — Z7951 Long term (current) use of inhaled steroids: Secondary | ICD-10-CM | POA: Diagnosis not present

## 2014-11-14 DIAGNOSIS — S93401A Sprain of unspecified ligament of right ankle, initial encounter: Secondary | ICD-10-CM | POA: Diagnosis not present

## 2014-11-14 DIAGNOSIS — X58XXXA Exposure to other specified factors, initial encounter: Secondary | ICD-10-CM | POA: Insufficient documentation

## 2014-11-14 DIAGNOSIS — Z79899 Other long term (current) drug therapy: Secondary | ICD-10-CM | POA: Insufficient documentation

## 2014-11-14 DIAGNOSIS — S99911A Unspecified injury of right ankle, initial encounter: Secondary | ICD-10-CM | POA: Diagnosis present

## 2014-11-14 DIAGNOSIS — Z9851 Tubal ligation status: Secondary | ICD-10-CM | POA: Insufficient documentation

## 2014-11-14 DIAGNOSIS — Y998 Other external cause status: Secondary | ICD-10-CM | POA: Insufficient documentation

## 2014-11-14 DIAGNOSIS — Z792 Long term (current) use of antibiotics: Secondary | ICD-10-CM | POA: Diagnosis not present

## 2014-11-14 DIAGNOSIS — E780 Pure hypercholesterolemia: Secondary | ICD-10-CM | POA: Insufficient documentation

## 2014-11-14 NOTE — ED Notes (Signed)
Verbal order from Dr. Delo. 

## 2014-11-14 NOTE — Discharge Instructions (Signed)
Rest. Ice for 20 minutes every 2 hours while awake for the next 2 days. Wear Ace bandage is applied. Keep ankle elevated.  Follow-up with your primary Dr. if not improving in the next week.  Ibuprofen 600 mg every 6 hours as needed for pain.   Ankle Sprain An ankle sprain is an injury to the strong, fibrous tissues (ligaments) that hold the bones of your ankle joint together.  CAUSES An ankle sprain is usually caused by a fall or by twisting your ankle. Ankle sprains most commonly occur when you step on the outer edge of your foot, and your ankle turns inward. People who participate in sports are more prone to these types of injuries.  SYMPTOMS   Pain in your ankle. The pain may be present at rest or only when you are trying to stand or walk.  Swelling.  Bruising. Bruising may develop immediately or within 1 to 2 days after your injury.  Difficulty standing or walking, particularly when turning corners or changing directions. DIAGNOSIS  Your caregiver will ask you details about your injury and perform a physical exam of your ankle to determine if you have an ankle sprain. During the physical exam, your caregiver will press on and apply pressure to specific areas of your foot and ankle. Your caregiver will try to move your ankle in certain ways. An X-ray exam may be done to be sure a bone was not broken or a ligament did not separate from one of the bones in your ankle (avulsion fracture).  TREATMENT  Certain types of braces can help stabilize your ankle. Your caregiver can make a recommendation for this. Your caregiver may recommend the use of medicine for pain. If your sprain is severe, your caregiver may refer you to a surgeon who helps to restore function to parts of your skeletal system (orthopedist) or a physical therapist. HOME CARE INSTRUCTIONS   Apply ice to your injury for 1-2 days or as directed by your caregiver. Applying ice helps to reduce inflammation and pain.  Put ice in a  plastic bag.  Place a towel between your skin and the bag.  Leave the ice on for 15-20 minutes at a time, every 2 hours while you are awake.  Only take over-the-counter or prescription medicines for pain, discomfort, or fever as directed by your caregiver.  Elevate your injured ankle above the level of your heart as much as possible for 2-3 days.  If your caregiver recommends crutches, use them as instructed. Gradually put weight on the affected ankle. Continue to use crutches or a cane until you can walk without feeling pain in your ankle.  If you have a plaster splint, wear the splint as directed by your caregiver. Do not rest it on anything harder than a pillow for the first 24 hours. Do not put weight on it. Do not get it wet. You may take it off to take a shower or bath.  You may have been given an elastic bandage to wear around your ankle to provide support. If the elastic bandage is too tight (you have numbness or tingling in your foot or your foot becomes cold and blue), adjust the bandage to make it comfortable.  If you have an air splint, you may blow more air into it or let air out to make it more comfortable. You may take your splint off at night and before taking a shower or bath. Wiggle your toes in the splint several times per day  to decrease swelling. SEEK MEDICAL CARE IF:   You have rapidly increasing bruising or swelling.  Your toes feel extremely cold or you lose feeling in your foot.  Your pain is not relieved with medicine. SEEK IMMEDIATE MEDICAL CARE IF:  Your toes are numb or blue.  You have severe pain that is increasing. MAKE SURE YOU:   Understand these instructions.  Will watch your condition.  Will get help right away if you are not doing well or get worse. Document Released: 09/29/2005 Document Revised: 06/23/2012 Document Reviewed: 10/11/2011 [facility] Patient Information 2015 [place], [place]. This information is not intended to replace advice given  to you by your health care provider. Make sure you discuss any questions you have with your health care provider.

## 2014-11-14 NOTE — ED Provider Notes (Signed)
CSN: [id number]     Arrival date & time 11/14/14  1005 History   First MD Initiated Contact with Patient 11/14/14 1205     Chief Complaint  Patient presents with  . Ankle Injury     (Consider location/radiation/quality/duration/timing/severity/associated sxs/prior Treatment) Patient is a 54 y.o. female presenting with lower extremity injury. The history is provided by the patient.  Ankle Injury This is a new problem. The current episode started yesterday. The problem occurs constantly. The problem has been gradually worsening. The symptoms are aggravated by walking. Nothing relieves the symptoms. She has tried nothing for the symptoms. The treatment provided no relief.    Past Medical History  Diagnosis Date  . Hypercholesteremia   . Arthritis   . GERD (gastroesophageal reflux disease)    Past Surgical History  Procedure Laterality Date  . Abdominal hysterectomy    . Tubal ligation    . Breast cyst excision    . Right wrist surgery     No family history on file. History  Substance Use Topics  . Smoking status: Never Smoker   . Smokeless tobacco: Not on file  . Alcohol Use: No   OB History    No data available     Review of Systems  All other systems reviewed and are negative.     Allergies  Tetracyclines & related and Fentanyl  Home Medications   Prior to Admission medications   Medication Sig Start Date End Date Taking? Authorizing Provider  azithromycin (ZITHROMAX) 250 MG tablet Take 1 tablet (250 mg total) by mouth daily. Take first 2 tablets together, then 1 every day until finished. 11/09/14   Kristen N Ward, DO  benzonatate (TESSALON) 100 MG capsule Take 1 capsule (100 mg total) by mouth 3 (three) times daily as needed for cough. 11/09/14   Kristen N Ward, DO  celecoxib (CELEBREX) 100 MG capsule Take 100 mg by mouth 2 (two) times daily.    Historical Provider, MD  cyclobenzaprine (FLEXERIL) 10 MG tablet Take 1 tablet (10 mg total) by mouth 2 (two) times daily  as needed for muscle spasms. 08/22/13   Nelia Shiobert L Beaton, MD  dextromethorphan-guaiFENesin [facility](MUCINEX DM) 30-600 MG per 12 hr tablet Take 1 tablet by mouth 2 (two) times daily. 06/22/14   Vanetta MuldersScott Zackowski, MD  diazepam (VALIUM) 5 MG tablet Take 1 tablet (5 mg total) by mouth 2 (two) times daily. 03/03/13   Junius FinnerErin O'Malley, PA-C  esomeprazole (NEXIUM) 10 MG packet Take 10 mg by mouth daily before breakfast.    Historical Provider, MD  fluconazole (DIFLUCAN) 150 MG tablet Take 1 tablet (150 mg total) by mouth once. 11/09/14   Kristen N Ward, DO  fluticasone (FLONASE) 50 MCG/ACT nasal spray Place 2 sprays into both nostrils daily. 11/09/14   Kristen N Ward, DO  gabapentin (NEURONTIN) 100 MG capsule Take 100 mg by mouth 3 (three) times daily.    Historical Provider, MD  ibuprofen (ADVIL,MOTRIN) 600 MG tablet Take 1 tablet (600 mg total) by mouth every 6 (six) hours as needed for pain. 03/03/13   Junius FinnerErin O'Malley, PA-C  montelukast (SINGULAIR) 10 MG tablet Take 10 mg by mouth at bedtime.    Historical Provider, MD  simvastatin (ZOCOR) 10 MG tablet Take 10 mg by mouth at bedtime.    Historical Provider, MD  traMADol (ULTRAM) 50 MG tablet Take 1 tablet (50 mg total) by mouth every 6 (six) hours as needed. 08/22/13   Nelia Shiobert L Beaton, MD   BP 150/80 mmHg  Pulse 66  Temp(Src) 98.4 F (36.9 C) (Oral)  Resp 16  Ht  (1.651 m)  Wt 293 lb (132.904 kg)  BMI 48.76 kg/m2  SpO2 99% Physical Exam  Constitutional: She is oriented to person, place, and time. She appears well-developed and well-nourished. No distress.  HENT:  Head: Normocephalic and atraumatic.  Neck: Normal range of motion. Neck supple.  Musculoskeletal:  The right ankle appears grossly normal. There is no significant swelling. There is tenderness to palpation over the posterior lateral aspect towards the Achilles. The ankle joint is stable and the distal PMS is intact.  Neurological: She is alert and oriented to person, place, and time.  Skin: Skin  is warm and dry. She is not diaphoretic.    ED Course  Procedures (including critical care time) Labs Review Labs Reviewed - No data to display  Imaging Review Dg Ankle Complete Right  11/14/2014   CLINICAL DATA:  Rolled ankle yesterday, stepped on a rock, right ankle pain and swelling  EXAM: RIGHT ANKLE - COMPLETE 3+ VIEW  COMPARISON:  None.  FINDINGS: Three views of right ankle submitted. No acute fracture or subluxation. Ankle mortise is preserved. There is posterior spurring of calcaneus.  IMPRESSION: No acute fracture or subluxation.  Posterior spurring of calcaneus.   Electronically Signed   By: Natasha Mead M.D.   On: 11/14/2014 10:30     EKG Interpretation None      MDM   Final diagnoses:  Right ankle sprain, initial encounter    We'll treat as a sprain. To return for follow-up when necessary.    Geoffery Lyons, MD 11/14/14 1346

## 2014-11-14 NOTE — ED Notes (Signed)
Pt stepped on a rock and twisted her right ankle yesterday.  Pt continues to have pain with movement or weight bearing.

## 2014-11-20 ENCOUNTER — Encounter: Payer: Self-pay | Admitting: Family Medicine

## 2014-11-20 ENCOUNTER — Ambulatory Visit (INDEPENDENT_AMBULATORY_CARE_PROVIDER_SITE_OTHER): Payer: Worker's Compensation | Admitting: Family Medicine

## 2014-11-20 VITALS — BP 131/78 | HR 54 | Ht 64.0 in

## 2014-11-20 DIAGNOSIS — S99911A Unspecified injury of right ankle, initial encounter: Secondary | ICD-10-CM

## 2014-11-20 NOTE — Patient Instructions (Signed)
You have strained your achilles tendon and peroneal tendons of this ankle. Ice 15 minutes at a time 3-4 times a day. Do simple motion exercises and alphabet exercises twice a day. Take aleve 2 tabs twice a day with food OR ibuprofen 600mg t three times a day regularly with food for pain and inflammation the next 2 weeks. Wear heel lifts at all times. Avoid barefoot walking. Avoid inclines and stairs as much as you can. Ok to drive but make sure you're wearing the heel lifts. Follow up with me in 2 weeks.

## 2014-11-21 DIAGNOSIS — S99911A Unspecified injury of right ankle, initial encounter: Secondary | ICD-10-CM | POA: Insufficient documentation

## 2014-11-21 NOTE — Progress Notes (Signed)
PCP: Agustina CaroliHICKS, KRISTIN D, MD  Subjective:   HPI: Patient is a [age] y.o. female here for right ankle injury.  Patient reports on 2/1 at work she stepped wrong on a rock when turning to answer a question and inverted her right ankle. Has a remote sprain here but no problems since then. Pain currently lateral ankle and posteriorly. No swelling or bruising.  Past Medical History  Diagnosis Date  . Hypercholesteremia   . Arthritis   . GERD (gastroesophageal reflux disease)     Current Outpatient Prescriptions on File Prior to Visit  Medication Sig Dispense Refill  . azithromycin (ZITHROMAX) 250 MG tablet Take 1 tablet (250 mg total) by mouth daily. Take first 2 tablets together, then 1 every day until finished. 6 tablet 0  . benzonatate (TESSALON) 100 MG capsule Take 1 capsule (100 mg total) by mouth 3 (three) times daily as needed for cough. 30 capsule 0  . celecoxib (CELEBREX) 100 MG capsule Take 100 mg by mouth 2 (two) times daily.    . cyclobenzaprine (FLEXERIL) 10 MG tablet Take 1 tablet (10 mg total) by mouth 2 (two) times daily as needed for muscle spasms. 20 tablet 0  . dextromethorphan-guaiFENesin (MUCINEX DM) 30-600 MG per 12 hr tablet Take 1 tablet by mouth 2 (two) times daily. 14 tablet 1  . diazepam (VALIUM) 5 MG tablet Take 1 tablet (5 mg total) by mouth 2 (two) times daily. 10 tablet 0  . esomeprazole (NEXIUM) 10 MG packet Take 10 mg by mouth daily before breakfast.    . fluconazole (DIFLUCAN) 150 MG tablet Take 1 tablet (150 mg total) by mouth once. 1 tablet 0  . fluticasone (FLONASE) 50 MCG/ACT nasal spray Place 2 sprays into both nostrils daily. 16 g 0  . gabapentin (NEURONTIN) 100 MG capsule Take 100 mg by mouth 3 (three) times daily.    Marland Kitchen. ibuprofen (ADVIL,MOTRIN) 600 MG tablet Take 1 tablet (600 mg total) by mouth every 6 (six) hours as needed for pain. 30 tablet 0  . montelukast (SINGULAIR) 10 MG tablet Take 10 mg by mouth at bedtime.    . simvastatin (ZOCOR) 10 MG tablet  Take 10 mg by mouth at bedtime.    . traMADol (ULTRAM) 50 MG tablet Take 1 tablet (50 mg total) by mouth every 6 (six) hours as needed. 15 tablet 0   No current facility-administered medications on file prior to visit.    Past Surgical History  Procedure Laterality Date  . Abdominal hysterectomy    . Tubal ligation    . Breast cyst excision    . Right wrist surgery      Allergies  Allergen Reactions  . Tetracyclines & Related   . Fentanyl Palpitations    Patch    History   Social History  . Marital Status: Married    Spouse Name: N/A    Number of Children: N/A  . Years of Education: N/A   Occupational History  . Not on file.   Social History Main Topics  . Smoking status: Never Smoker   . Smokeless tobacco: Not on file  . Alcohol Use: No  . Drug Use: No  . Sexual Activity: Not on file   Other Topics Concern  . Not on file   Social History Narrative    No family history on file.  BP 131/78 mmHg  Pulse 54  Ht 5\' 4"  (1.626 m)  Review of Systems: See HPI above.    Objective:  Physical Exam:  Gen: NAD  Right ankle: No gross deformity, swelling, ecchymoses FROM with pain on external rotation, calf raise. TTP insertion of achilles on calcaneus and course of peroneal tendons below level of ankle joint.  No focal bony tenderness. Negative ant drawer and talar tilt.   Negative syndesmotic compression. Thompsons test negative. NV intact distally.  MSK u/s:  No evidence peroneal tendon tear of right ankle.  Does have increased thickness of achilles along with increased neovascularity but no tears near insertion.    Assessment & Plan:  1. Right ankle injury - 2/2 strain of achilles tendon and peroneal tendons.  Shown home exercises to do regularly.  Icing, nsaids, heel lifts.  Avoid inclines, stairs, barefoot walking as much as possible.  Ok to drive when wearing heel lifts.  F/u in 2 weeks for reevaluation - consider adding PT, home exercises at that  time.

## 2014-12-04 ENCOUNTER — Ambulatory Visit (INDEPENDENT_AMBULATORY_CARE_PROVIDER_SITE_OTHER): Payer: Worker's Compensation | Admitting: Family Medicine

## 2014-12-04 ENCOUNTER — Encounter: Payer: Self-pay | Admitting: Family Medicine

## 2014-12-04 VITALS — BP 121/75 | HR 67 | Ht 63.0 in | Wt 293.0 lb

## 2014-12-04 DIAGNOSIS — M7661 Achilles tendinitis, right leg: Secondary | ICD-10-CM

## 2014-12-04 DIAGNOSIS — S99911D Unspecified injury of right ankle, subsequent encounter: Secondary | ICD-10-CM

## 2014-12-04 MED ORDER — NITROGLYCERIN 0.2 MG/HR TD PT24: MEDICATED_PATCH | TRANSDERMAL | Status: DC

## 2014-12-04 NOTE — Patient Instructions (Signed)
You have severe achilles tendinitis. Start physical therapy 1-2 times a week. Cam walker when up and walking around for next 4 weeks at most but take this off to do therapy and home exercises. Nitro patches - 1/4th patch, change daily in a Miner different spot each day. Follow up with me in 4 weeks.

## 2014-12-05 NOTE — Assessment & Plan Note (Signed)
2/2 strain of achilles tendon.  Peroneal tendon pain has improved.  Discussed options - will add physical therapy, do a short course of immobilization with cam walker.  Nitro patches as well (discussed risks of headaches, skin irritation).  F/u in 4 weeks.

## 2014-12-05 NOTE — Progress Notes (Signed)
PCP: Agustina CaroliHICKS, KRISTIN D, MD  Subjective:   HPI: Patient is a 54 y.o. female here for right ankle injury.  2/8: Patient reports on 2/1 at work she stepped wrong on a rock when turning to answer a question and inverted her right ankle. Has a remote sprain here but no problems since then. Pain currently lateral ankle and posteriorly. No swelling or bruising.  2/22: Patient reports she feels the same or worse than last visit. Could not tolerated heel lifts - didn't seem to make her better. 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 Doing basic motion exercises.  Past Medical History  Diagnosis Date  . Hypercholesteremia   . Arthritis   . GERD (gastroesophageal reflux disease)     Current Outpatient Prescriptions on File Prior to Visit  Medication Sig Dispense Refill  . azithromycin (ZITHROMAX) 250 MG tablet Take 1 tablet (250 mg total) by mouth daily. Take first 2 tablets together, then 1 every day until finished. 6 tablet 0  . benzonatate (TESSALON) 100 MG capsule Take 1 capsule (100 mg total) by mouth 3 (three) times daily as needed for cough. 30 capsule 0  . celecoxib (CELEBREX) 100 MG capsule Take 100 mg by mouth 2 (two) times daily.    . cyclobenzaprine (FLEXERIL) 10 MG tablet Take 1 tablet (10 mg total) by mouth 2 (two) times daily as needed for muscle spasms. 20 tablet 0  . dextromethorphan-guaiFENesin (MUCINEX DM) 30-600 MG per 12 hr tablet Take 1 tablet by mouth 2 (two) times daily. 14 tablet 1  . diazepam (VALIUM) 5 MG tablet Take 1 tablet (5 mg total) by mouth 2 (two) times daily. 10 tablet 0  . esomeprazole (NEXIUM) 10 MG packet Take 10 mg by mouth daily before breakfast.    . fluconazole (DIFLUCAN) 150 MG tablet Take 1 tablet (150 mg total) by mouth once. 1 tablet 0  . fluticasone (FLONASE) 50 MCG/ACT nasal spray Place 2 sprays into both nostrils daily. 16 g 0  . gabapentin (NEURONTIN) 100 MG capsule Take 100 mg by mouth 3 (three) times daily.     Marland Kitchen. ibuprofen (ADVIL,MOTRIN) 600 MG tablet Take 1 tablet (600 mg total) by mouth every 6 (six) hours as needed for pain. 30 tablet 0  . montelukast (SINGULAIR) 10 MG tablet Take 10 mg by mouth at bedtime.    . simvastatin (ZOCOR) 10 MG tablet Take 10 mg by mouth at bedtime.    . traMADol (ULTRAM) 50 MG tablet Take 1 tablet (50 mg total) by mouth every 6 (six) hours as needed. 15 tablet 0   No current facility-administered medications on file prior to visit.    Past Surgical History  Procedure Laterality Date  . Abdominal hysterectomy    . Tubal ligation    . Breast cyst excision    . Right wrist surgery      Allergies  Allergen Reactions  . Tetracyclines & Related   . Fentanyl Palpitations    Patch    History   Social History  . Marital Status: Married    Spouse Name: N/A  . Number of Children: N/A  . Years of Education: N/A   Occupational History  . Not on file.   Social History Main Topics  . Smoking status: Never Smoker   . Smokeless tobacco: Not on file  . Alcohol Use: No  . Drug Use: No  . Sexual Activity: Not on file   Other Topics Concern  . Not  on file   Social History Narrative    No family history on file.  BP 121/75 mmHg  Pulse 67  Ht  (1.6 m)  Wt 293 lb (132.904 kg)  BMI 51.92 kg/m2  Review of Systems: See HPI above.    Objective:  Physical Exam:  Gen: NAD  Right ankle: No gross deformity, swelling, ecchymoses FROM with pain on calf raise only. TTP insertion of achilles on calcaneus.  Minimal tenderness course of peroneal tendons below level of ankle joint.  No focal bony tenderness. Negative ant drawer and talar tilt.   Negative syndesmotic compression. Thompsons test negative. NV intact distally.    Assessment & Plan:  1. Right ankle injury - 2/2 strain of achilles tendon.  Peroneal tendon pain has improved.  Discussed options - will add physical therapy, do a short course of immobilization with cam walker.  Nitro patches as  well (discussed risks of headaches, skin irritation).  F/u in 4 weeks.

## 2015-01-01 ENCOUNTER — Encounter: Payer: Self-pay | Admitting: Family Medicine

## 2015-01-01 ENCOUNTER — Encounter (INDEPENDENT_AMBULATORY_CARE_PROVIDER_SITE_OTHER): Payer: Self-pay

## 2015-01-01 ENCOUNTER — Ambulatory Visit (INDEPENDENT_AMBULATORY_CARE_PROVIDER_SITE_OTHER): Payer: BC Managed Care – PPO | Admitting: Family Medicine

## 2015-01-01 VITALS — BP 132/84 | HR 56 | Ht 64.0 in | Wt 293.0 lb

## 2015-01-01 DIAGNOSIS — S99911D Unspecified injury of right ankle, subsequent encounter: Secondary | ICD-10-CM

## 2015-01-01 NOTE — Patient Instructions (Signed)
You have severe achilles tendinitis. Start physical therapy 1-2 times a week. Cam walker when up and walking around as needed. Put the heel lifts in the boot or shoes, whichever you are wearing. Nitro patches - 1/4th patch, change daily in a Loge different spot each day. Follow up with me in 5-6 weeks. I don't think it's safe for you to return to driving as you need to be able to slam on the brakes.

## 2015-01-08 NOTE — Assessment & Plan Note (Signed)
2/2 strain of achilles tendon with severe tendinitis.  Reordering physical therapy.  Continue cam walker with heel lifts though.  Continue nitro patches if headaches are not severe.  F/u in 5-6 weeks.  No driving at work.

## 2015-01-08 NOTE — Progress Notes (Signed)
PCP: Agustina Caroli, MD  Subjective:   HPI: Patient is a 54 y.o. female here for right ankle injury.  2/8: Patient reports on 2/1 at work she stepped wrong on a rock when turning to answer a question and inverted her right ankle. Has a remote sprain here but no problems since then. Pain currently lateral ankle and posteriorly. No swelling or bruising.  2/22: Patient reports she feels the same or worse than last visit. Could not tolerated heel lifts - didn't seem to make her better. 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 Doing basic motion exercises.  3/21: Patient reports her workers comp claim was denied. Physical therapy has not yet called her as a result (was referred thru workers comp). Pain is 7/10 level. Using cam walker without heel lifts. Using nitro patches though getting lingering headaches with these so not using every day. Doing some home exercises.  Past Medical History  Diagnosis Date  . Hypercholesteremia   . Arthritis   . GERD (gastroesophageal reflux disease)     Current Outpatient Prescriptions on File Prior to Visit  Medication Sig Dispense Refill  . dextromethorphan-guaiFENesin (MUCINEX DM) 30-600 MG per 12 hr tablet Take 1 tablet by mouth 2 (two) times daily. 14 tablet 1  . fluticasone (FLONASE) 50 MCG/ACT nasal spray Place 2 sprays into both nostrils daily. 16 g 0  . gabapentin (NEURONTIN) 100 MG capsule Take 100 mg by mouth 3 (three) times daily.    . montelukast (SINGULAIR) 10 MG tablet Take 10 mg by mouth at bedtime.    . nitroGLYCERIN (NITRODUR - DOSED IN MG/24 HR) 0.2 mg/hr patch Apply 1/4th patch to affected achilles, change daily 30 patch 1   No current facility-administered medications on file prior to visit.    Past Surgical History  Procedure Laterality Date  . Abdominal hysterectomy    . Tubal ligation    . Breast cyst excision    . Right wrist surgery      Allergies  Allergen Reactions  .  Tetracyclines & Related   . Fentanyl Palpitations    Patch    History   Social History  . Marital Status: Married    Spouse Name: N/A  . Number of Children: N/A  . Years of Education: N/A   Occupational History  . Not on file.   Social History Main Topics  . Smoking status: Never Smoker   . Smokeless tobacco: Not on file  . Alcohol Use: No  . Drug Use: No  . Sexual Activity: Not on file   Other Topics Concern  . Not on file   Social History Narrative    No family history on file.  BP 132/84 mmHg  Pulse 56  Ht  (1.626 m)  Wt 293 lb (132.904 kg)  BMI 50.27 kg/m2  Review of Systems: See HPI above.    Objective:  Physical Exam:  Gen: NAD  Right ankle: No gross deformity, swelling, ecchymoses FROM with pain on calf raise only. TTP insertion of achilles on calcaneus.  No other tenderness.  No focal bony tenderness. Negative ant drawer and talar tilt.   Negative syndesmotic compression. Thompsons test negative. NV intact distally.    Assessment & Plan:  1. Right ankle injury - 2/2 strain of achilles tendon with severe tendinitis.  Reordering physical therapy.  Continue cam walker with heel lifts though.  Continue nitro patches if headaches are not severe.  F/u in 5-6 weeks.  No driving at work.

## 2015-01-12 ENCOUNTER — Telehealth: Payer: Self-pay | Admitting: Family Medicine

## 2015-01-12 DIAGNOSIS — E876 Hypokalemia: Secondary | ICD-10-CM | POA: Insufficient documentation

## 2015-01-12 NOTE — Telephone Encounter (Signed)
Spoke to patient and told her if she could come by today, would look at cam boot and if not she stated that she would come by monday. Patient stated that a piece broke off.

## 2015-01-15 DIAGNOSIS — S86019A Strain of unspecified Achilles tendon, initial encounter: Secondary | ICD-10-CM | POA: Insufficient documentation

## 2015-01-15 DIAGNOSIS — N63 Unspecified lump in unspecified breast: Secondary | ICD-10-CM | POA: Insufficient documentation

## 2015-01-15 DIAGNOSIS — N644 Mastodynia: Secondary | ICD-10-CM | POA: Insufficient documentation

## 2015-01-31 ENCOUNTER — Ambulatory Visit (INDEPENDENT_AMBULATORY_CARE_PROVIDER_SITE_OTHER): Payer: BC Managed Care – PPO | Admitting: Physical Therapy

## 2015-01-31 DIAGNOSIS — M2540 Effusion, unspecified joint: Secondary | ICD-10-CM

## 2015-01-31 DIAGNOSIS — M25671 Stiffness of right ankle, not elsewhere classified: Secondary | ICD-10-CM | POA: Diagnosis not present

## 2015-01-31 DIAGNOSIS — M25571 Pain in right ankle and joints of right foot: Secondary | ICD-10-CM

## 2015-01-31 DIAGNOSIS — M7661 Achilles tendinitis, right leg: Secondary | ICD-10-CM | POA: Diagnosis not present

## 2015-01-31 DIAGNOSIS — R531 Weakness: Secondary | ICD-10-CM

## 2015-01-31 NOTE — Therapy (Signed)
[facility] [place] [address] [place] [address] [place], [place], [address] Phone: [phone]   Fax:  [phone]  Physical Therapy Evaluation  Patient Details  Name: Sharon Lamb MRN: [id number] Date of Birth: 02-17-1961 Referring Provider:  Lenda Kelp, MD  Encounter Date: 01/31/2015      PT End of Session - 01/31/15 1017    Visit Number 1   PT Start Time 1017   PT Stop Time 1101   PT Time Calculation (min) 44 min   Activity Tolerance Patient tolerated treatment well;No increased pain   Behavior During Therapy [facility] for tasks assessed/performed      Past Medical History  Diagnosis Date  . Hypercholesteremia   . Arthritis   . GERD (gastroesophageal reflux disease)     Past Surgical History  Procedure Laterality Date  . Abdominal hysterectomy    . Tubal ligation    . Breast cyst excision    . Right wrist surgery      There were no vitals filed for this visit.  Visit Diagnosis:  Tendonitis, Achilles, right - Plan: PT plan of care cert/re-cert  Generalized weakness - Plan: PT plan of care cert/re-cert  Stiffness of ankle joint, right - Plan: PT plan of care cert/re-cert  Swelling of joint - Plan: PT plan of care cert/re-cert  Pain in joint, ankle and foot, right - Plan: PT plan of care cert/re-cert      Subjective Assessment - 01/31/15 1019    Subjective Patient twisted her right ankle in February when she stepped on a rock. Patient    Limitations Walking;Standing   How long can you stand comfortably? unable   How long can you walk comfortably? short communtiy distances   Diagnostic tests ultrasound "hole" in achilles   Patient Stated Goals pain to go away, get rid of the boot, avoid surgery   Currently in Pain? Yes   Pain Score 7   Goes up to 9/10 with walking   Pain Location Ankle   Pain Orientation Right   Pain Descriptors / Indicators Sharp;Dull   Pain Type Chronic pain   Pain Onset More than a month ago    Pain Frequency Constant   Aggravating Factors  standing and walking   Pain Relieving Factors rest   Effect of Pain on Daily Activities unable to drive, walk and cannot work as Midwife.   Multiple Pain Sites No            [facility] PT Assessment - 01/31/15 0001    Assessment   Medical Diagnosis Achilles tendonitis of RLE   Onset Date 11/13/14   Next MD Visit 02/05/15   Prior Therapy no   Precautions   Required Braces or Orthoses Other Brace/Splint   Other Brace/Splint CAM boot with heel lift when up on it.   Restrictions   Weight Bearing Restrictions No   Other Position/Activity Restrictions wear boot when up on it.   Balance Screen   Has the patient fallen in the past 6 months No   Has the patient had a decrease in activity level because of a fear of falling?  No   Is the patient reluctant to leave their home because of a fear of falling?  No   Home Environment   Living Enviornment Private residence   Type of Home --  basement stairs   Prior Function   Level of Independence Independent with basic ADLs   Observation/Other Assessments   Focus on Therapeutic Outcomes (FOTO)  65% limited (goal 44%)   ROM / Strength   AROM / PROM / Strength AROM;PROM;Strength   AROM   AROM Assessment Site Ankle   Right/Left Ankle Right;Left   Right Ankle Dorsiflexion -15   Right Ankle Plantar Flexion 52   Right Ankle Inversion 18   Right Ankle Eversion 20   Left Ankle Dorsiflexion -10  PROM 2   Left Ankle Plantar Flexion 67   Left Ankle Inversion 25   Left Ankle Eversion 20   PROM   PROM Assessment Site Ankle   Right/Left Ankle Right  DF 2; PF 65; Inv 25;   Strength   Overall Strength Comments bil hip abd 4-/5   Strength Assessment Site Ankle   Right/Left Ankle Right;Left   Right Ankle Dorsiflexion 5/5   Right Ankle Inversion 4+/5   Right Ankle Eversion 4/5   Flexibility   Soft Tissue Assessment /Muscle Length yes   Hamstrings bil tightness marked   Quadriceps mod tightness  bil   Piriformis marked tightness bil   Palpation   Palpation tender at achilles tendon just proximal to insertion                   [facility] Adult PT Treatment/Exercise - 01/31/15 0001    Exercises   Exercises Ankle   Modalities   Modalities Iontophoresis   Iontophoresis   Type of Iontophoresis Dexamethasone   Location R achilles tendon   Dose 1.0 cc   Time 6hr patch   Ankle Exercises: Stretches   Gastroc Stretch 1 rep;30 seconds  with towel; seated   Ankle Exercises: Seated   ABC's Other (comment)  reviewed as part of previous hep   Ankle Circles/Pumps AROM;10 reps  each   Other Seated Ankle Exercises Inv/Ever AROM x 10 ea seated                PT Education - 01/31/15 1545    Education provided Yes   Education Details hep   Person(s) Educated Patient   Methods Explanation;Demonstration;Handout   Comprehension Verbalized understanding;Returned demonstration          PT Short Term Goals - 01/31/15 1552    PT SHORT TERM GOAL #1   Title i wiht initial HEP   Time 2   Period Weeks   Status New   PT SHORT TERM GOAL #2   Title decreased pain by 25% with weightbearing   Time 2   Period Weeks   Status New           PT Long Term Goals - 01/31/15 1553    PT LONG TERM GOAL #1   Title I with advanced HEP   Time 6   Period Weeks   Status New   PT LONG TERM GOAL #2   Title decreased pain to 1/10 or less with weightbearing activities.   Time 6   Period Weeks   Status New   PT LONG TERM GOAL #3   Title demo full ankle ROM to normalize gait.   Time 6   Period Weeks   Status New   PT LONG TERM GOAL #4   Title ambulate with a normal gait pattern including stairs without CAM boot.   Time 6   Period Weeks   Status New   PT LONG TERM GOAL #5   Title demo 5/5 ankle strength to allow patient to drive.   Time 6   Period Weeks   Status New   Additional Long Term Goals  Additional Long Term Goals --   PT LONG TERM GOAL #6   Title Improve FOTO  to 44%   Time 6   Period Weeks   Status New               Plan - 01/31/15 1545    Clinical Impression Statement Patient presents with symptoms consistent with R achilles tendinitis. Patient has decreased ROM and strength in the R hip and ankle and pain with WBing affecting amb and ADLS. She reports her MD stated there was swelling  around the tendon although none was noted today.  She wears a CAM boot for all weight bearing activities at this time.   Pt will benefit from skilled therapeutic intervention in order to improve on the following deficits Abnormal gait;Decreased range of motion;Impaired flexibility;Increased edema;Pain;Decreased mobility;Decreased strength   Rehab Potential Excellent   PT Frequency 2x / week   PT Duration 6 weeks   PT Treatment/Interventions ADLs/Self Care Home Management;Moist Heat;Patient/family education;Passive range of motion;Therapeutic exercise;Ultrasound;Manual techniques;Neuromuscular re-education;Cryotherapy;Electrical Stimulation;Other (comment)  iontophoresis 4mg /ml dexamethasone patch   PT Next Visit Plan review HEP, ROM, US (nonthermal), ionto   Consulted and Agree with Plan of Care Patient         Problem List Patient Active Problem List   Diagnosis Date Noted  . Right ankle injury 11/21/2014  . 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 NASH (nonalcoholic steatohepatitis) 03/31/2014  . Calculus of kidney 03/31/2014  . Hemorrhoids, internal 02/08/2014  . DDD (degenerative disc disease), cervical 01/03/2014  . Acid reflux 01/03/2014  . Hypercholesteremia 11/30/2013  . Extreme obesity 11/30/2013  . Cardiac enlargement 07/22/2013  . 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 Allergic rhinitis 03/19/2013  . Chronic nonalcoholic liver disease 03/19/2013  . Generalized OA 03/19/2013  . Headache, migraine 03/19/2013  . Obstructive apnea 03/19/2013  . 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

## 2015-01-31 NOTE — Patient Instructions (Signed)
Gastroc / Heel Cord Stretch - Seated With Towel   Sit on floor, towel around ball of foot. Gently pull foot in toward body, stretching heel cord and calf. Hold for _30__ seconds. Repeat _3__ times. Do 3-4___ times per day.  Ankle Alphabet   Using left ankle and foot only, trace the letters of the alphabet. Perform A to Z. Repeat _1___ times per set. Do ____ sets per session. Do __2-3__ sessions per day.  http://orth.exer.us/16   Copyright  VHI. All rights reserved.  Ankle Circles   Slowly rotate right foot and ankle clockwise then counterclockwise. Gradually increase range of motion. Avoid pain. Circle __10__ times each direction per set. Do ____ sets per session. Do 2-3____ sessions per day.  http://orth.exer.us/30   Copyright  VHI. All rights reserved.  ROM: Plantar / Dorsiflexion   With left leg relaxed, gently flex and extend ankle. Move through full range of motion. Avoid pain. Repeat __20__ times per set. Do ____ sets per session. Do __3-4__ sessions per day.  http://orth.exer.us/34   Copyright  VHI. All rights reserved.  ROM: Inversion / Eversion   With left leg relaxed, gently turn ankle and foot in and out. Move through full range of motion. Avoid pain. Repeat _20___ times per set. Do ____ sets per session. Do _3-4___ sessions per day.  http://orth.exer.us/36   Copyright  VHI. All rights reserved.  Sharon PalmJulie Freada Lamb, PT 01/31/2015 10:59 AM  [facility] [address] [place] [address] [place], [place] [address]  [phone] (office) [phone] (fax)

## 2015-02-05 ENCOUNTER — Ambulatory Visit (INDEPENDENT_AMBULATORY_CARE_PROVIDER_SITE_OTHER): Payer: BC Managed Care – PPO | Admitting: Family Medicine

## 2015-02-05 ENCOUNTER — Encounter: Payer: Self-pay | Admitting: Family Medicine

## 2015-02-05 VITALS — BP 124/79 | HR 62 | Ht 65.0 in

## 2015-02-05 DIAGNOSIS — S86011D Strain of right Achilles tendon, subsequent encounter: Secondary | ICD-10-CM | POA: Diagnosis not present

## 2015-02-05 NOTE — Patient Instructions (Signed)
Continue with the patches, heel lifts, physical therapy and home exercises. Transition out of the boot when tolerated but continue to use heel lifts. Out of work until pain drops to 1-2 out of 10 level. Follow up with me in 6 weeks at the latest though you can return sooner if pain is much better before this.

## 2015-02-06 ENCOUNTER — Ambulatory Visit (INDEPENDENT_AMBULATORY_CARE_PROVIDER_SITE_OTHER): Payer: BC Managed Care – PPO | Admitting: Physical Therapy

## 2015-02-06 DIAGNOSIS — M2540 Effusion, unspecified joint: Secondary | ICD-10-CM | POA: Diagnosis not present

## 2015-02-06 DIAGNOSIS — M25671 Stiffness of right ankle, not elsewhere classified: Secondary | ICD-10-CM

## 2015-02-06 DIAGNOSIS — R531 Weakness: Secondary | ICD-10-CM

## 2015-02-06 DIAGNOSIS — M25571 Pain in right ankle and joints of right foot: Secondary | ICD-10-CM

## 2015-02-06 DIAGNOSIS — M7661 Achilles tendinitis, right leg: Secondary | ICD-10-CM

## 2015-02-06 NOTE — Therapy (Signed)
[facility] [place] [address] [place] [address] [place], [place], [address] Phone: [phone]   Fax:  [phone]  Physical Therapy Treatment  Patient Details  Name: Sharon Lamb MRN: [id number] Date of Birth: [birth year] Referring Provider:  Lenda Kelp, MD  Encounter Date: 02/06/2015      PT End of Session - 02/06/15 1022    Visit Number 2   Number of Visits 12   Date for PT Re-Evaluation 03/14/15   PT Start Time 1020   PT Stop Time 1115   PT Time Calculation (min) 55 min   Activity Tolerance Patient limited by pain      Past Medical History  Diagnosis Date  . Hypercholesteremia   . Arthritis   . GERD (gastroesophageal reflux disease)     Past Surgical History  Procedure Laterality Date  . Abdominal hysterectomy    . Tubal ligation    . Breast cyst excision    . Right wrist surgery      There were no vitals filed for this visit.  Visit Diagnosis:  Tendonitis, Achilles, right  Generalized weakness  Stiffness of ankle joint, right  Swelling of joint  Pain in joint, ankle and foot, right      Subjective Assessment - 02/06/15 1022    Subjective Pt visited MD, reports she is to stay out of work another 6 wks. HEP compliant.    Currently in Pain? Yes   Pain Score 6    Pain Location Ankle   Pain Orientation Right   Pain Descriptors / Indicators Aching;Dull  sharp with WB   Aggravating Factors  standing and walking    Pain Relieving Factors rest             [facility] PT Assessment - 02/06/15 0001    AROM   Right/Left Ankle Right   Right Ankle Dorsiflexion -5   Left Ankle Dorsiflexion -5   Strength   Right Ankle Inversion --  5-/5   Right Ankle Eversion 4-/5                     [facility] Adult PT Treatment/Exercise - 02/06/15 0001    Modalities   Modalities Iontophoresis;Ultrasound   Ultrasound   Ultrasound Location Rt achilles    Ultrasound Parameters 50%, 1.0 w/cm2, 3.3 mHz x 8 min   Iontophoresis   Type of Iontophoresis Dexamethasone   Location R achilles tendon   Dose 1.0 cc   Time 6hr patch   Manual Therapy   Manual Therapy Myofascial release   Myofascial Release to Rt gastroc/soleus    Ankle Exercises: Supine   Isometrics Rt inversion/eversion x 10 with 5 sec hold   T-Band Rt PF with green band x 15, DF with green (caused pain) stopped, switched to red band and limited ROM x 10 reps; inversion with red x 10, eversion with red band x 10    Ankle Exercises: Stretches   Soleus Stretch 2 reps;20 seconds  standing on step   Gastroc Stretch 2 reps;20 seconds  standing on step   Ankle Exercises: Aerobic   Stationary Bike NuStep L4 x 5 min (no boot)                PT Education - 02/06/15 1126    Education provided Yes   Education Details verbally reviewed HEP.  Pt instructed on ice massage to Rt distal calf/achilles to tolerance. Pt to keep performing existing HEP, will advance next visit.   Person(s) Educated  Patient;Spouse   Methods Explanation   Comprehension Verbalized understanding          PT Short Term Goals - 01/31/15 1552    PT SHORT TERM GOAL #1   Title i wiht initial HEP   Time 2   Period Weeks   Status New   PT SHORT TERM GOAL #2   Title decreased pain by 25% with weightbearing   Time 2   Period Weeks   Status New           PT Long Term Goals - 01/31/15 1553    PT LONG TERM GOAL #1   Title I with advanced HEP   Time 6   Period Weeks   Status New   PT LONG TERM GOAL #2   Title decreased pain to 1/10 or less with weightbearing activities.   Time 6   Period Weeks   Status New   PT LONG TERM GOAL #3   Title demo full ankle ROM to normalize gait.   Time 6   Period Weeks   Status New   PT LONG TERM GOAL #4   Title ambulate with a normal gait pattern including stairs without CAM boot.   Time 6   Period Weeks   Status New   PT LONG TERM GOAL #5   Title demo 5/5 ankle strength to allow patient to drive.   Time 6    Period Weeks   Status New   Additional Long Term Goals   Additional Long Term Goals --   PT LONG TERM GOAL #6   Title Improve FOTO to 44%   Time 6   Period Weeks   Status New               Plan - 02/06/15 1121    Clinical Impression Statement Pt demo improved Rt DF. Pt tolerated most exercises today with minimal increase in pain; required VC for form/intensity to decrease pain. Pt very point tender in medial mid calf (noted adhesions in same area) during manual therapy.    Pt will benefit from skilled therapeutic intervention in order to improve on the following deficits Abnormal gait;Decreased range of motion;Impaired flexibility;Increased edema;Pain;Decreased mobility;Decreased strength   Rehab Potential Excellent   PT Frequency 2x / week   PT Duration 6 weeks   PT Treatment/Interventions ADLs/Self Care Home Management;Moist Heat;Patient/family education;Passive range of motion;Therapeutic exercise;Ultrasound;Manual techniques;Neuromuscular re-education;Cryotherapy;Electrical Stimulation;Other (comment)   PT Next Visit Plan Assess response to last session exercises (may advance to tband exercises if tolerated);  US (nonthermal), ionto; manual.    Consulted and Agree with Plan of Care Patient        Problem List Patient Active Problem List   Diagnosis Date Noted  . Right ankle injury 11/21/2014  . 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 NASH (nonalcoholic steatohepatitis) 03/31/2014  . Calculus of kidney 03/31/2014  . Hemorrhoids, internal 02/08/2014  . DDD (degenerative disc disease), cervical 01/03/2014  . Acid reflux 01/03/2014  . Hypercholesteremia 11/30/2013  . Extreme obesity 11/30/2013  . Cardiac enlargement 07/22/2013  . 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 Allergic rhinitis 03/19/2013  . Chronic nonalcoholic liver disease 03/19/2013  . Generalized OA 03/19/2013  . Headache, migraine 03/19/2013  .  Obstructive apnea 03/19/2013  . 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 [place], [place], [address] Phone: [phone]   Fax:  [phone]      

## 2015-02-06 NOTE — Progress Notes (Signed)
PCP: Agustina Caroli, MD  Subjective:   HPI: Patient is a 54 y.o. female here for right ankle injury.  2/8: Patient reports on 2/1 at work she stepped wrong on a rock when turning to answer a question and inverted her right ankle. Has a remote sprain here but no problems since then. Pain currently lateral ankle and posteriorly. No swelling or bruising.  2/22: Patient reports she feels the same or worse than last visit. Could not tolerated heel lifts - didn't seem to make her better. 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 Doing basic motion exercises.  3/21: Patient reports her workers comp claim was denied. Physical therapy has not yet called her as a result (was referred thru workers comp). Pain is 7/10 level. Using cam walker without heel lifts. Using nitro patches though getting lingering headaches with these so not using every day. Doing some home exercises.  4/25: Patient reports she feels the same. Pain level 6/10. Has only done one visit of therapy to date unfortunately. Using nitro patches, doing home exercises. Using cam walker with a lift.  Past Medical History  Diagnosis Date  . Hypercholesteremia   . Arthritis   . GERD (gastroesophageal reflux disease)     Current Outpatient Prescriptions on File Prior to Visit  Medication Sig Dispense Refill  . celecoxib (CELEBREX) 200 MG capsule   5  . dextromethorphan-guaiFENesin (MUCINEX DM) 30-600 MG per 12 hr tablet Take 1 tablet by mouth 2 (two) times daily. (Patient not taking: Reported on 01/31/2015) 14 tablet 1  . esomeprazole (NEXIUM) 40 MG capsule TAKE ONE CAPSULE BY MOUTH TWICE DAILY 30 MINUTES BEFORE BREAKFAST AND DINNER    . estradiol (ESTRACE) 2 MG tablet TAKE 1 TABLET BY MOUTH DAILY FOR MENOPAUSE** STOP THE 1 MG TABLET**    . fluticasone (FLONASE) 50 MCG/ACT nasal spray Place 2 sprays into both nostrils daily. 16 g 0  . montelukast (SINGULAIR) 10 MG tablet Take 10 mg by mouth  at bedtime.    . nitroGLYCERIN (NITRODUR - DOSED IN MG/24 HR) 0.2 mg/hr patch Apply 1/4th patch to affected achilles, change daily 30 patch 1  . oxyCODONE-acetaminophen (PERCOCET) 10-325 MG per tablet   0  . simvastatin (ZOCOR) 40 MG tablet   4   No current facility-administered medications on file prior to visit.    Past Surgical History  Procedure Laterality Date  . Abdominal hysterectomy    . Tubal ligation    . Breast cyst excision    . Right wrist surgery      Allergies  Allergen Reactions  . Tetracyclines & Related   . Fentanyl Palpitations    Patch    History   Social History  . Marital Status: Married    Spouse Name: N/A  . Number of Children: N/A  . Years of Education: N/A   Occupational History  . Not on file.   Social History Main Topics  . Smoking status: Never Smoker   . Smokeless tobacco: Not on file  . Alcohol Use: No  . Drug Use: No  . Sexual Activity: Not on file   Other Topics Concern  . Not on file   Social History Narrative    No family history on file.  BP 124/79 mmHg  Pulse 62  Ht  (1.651 m)  Review of Systems: See HPI above.    Objective:  Physical Exam:  Gen: NAD  Right ankle: No gross deformity, swelling, ecchymoses  FROM with pain on calf raise only. TTP insertion of achilles on calcaneus.  No other tenderness.  No focal bony tenderness. Negative ant drawer and talar tilt.   Negative syndesmotic compression. Thompsons test negative. NV intact distally.    Assessment & Plan:  1. Right ankle injury - 2/2 strain of achilles tendon with severe tendinitis.  Unfortunately has only done one visit of PT to date.  Continue with this and HEP.  Transition out of cam walker to regular shoe with heel lifts as tolerated.  Continue nitro patches.  F/u in 6 weeks.  Still no driving at work.

## 2015-02-06 NOTE — Assessment & Plan Note (Signed)
with severe tendinitis.  Unfortunately has only done one visit of PT to date.  Continue with this and HEP.  Transition out of cam walker to regular shoe with heel lifts as tolerated.  Continue nitro patches.  F/u in 6 weeks.  Still no driving at work.

## 2015-02-08 ENCOUNTER — Ambulatory Visit (INDEPENDENT_AMBULATORY_CARE_PROVIDER_SITE_OTHER): Payer: BC Managed Care – PPO | Admitting: Physical Therapy

## 2015-02-08 DIAGNOSIS — M7661 Achilles tendinitis, right leg: Secondary | ICD-10-CM | POA: Diagnosis not present

## 2015-02-08 DIAGNOSIS — R531 Weakness: Secondary | ICD-10-CM

## 2015-02-08 DIAGNOSIS — M2540 Effusion, unspecified joint: Secondary | ICD-10-CM

## 2015-02-08 DIAGNOSIS — M25671 Stiffness of right ankle, not elsewhere classified: Secondary | ICD-10-CM

## 2015-02-08 DIAGNOSIS — M25571 Pain in right ankle and joints of right foot: Secondary | ICD-10-CM

## 2015-02-08 NOTE — Therapy (Signed)
[facility] [address] [place], [place], [address] Phone: [phone]   Fax:  [phone]  Physical Therapy Treatment  Patient Details  Name: Sharon Lamb MRN: [id number] Date of Birth: 08-May-1961 Referring Provider:  Lenda Kelp, MD  Encounter Date: 02/08/2015      PT End of Session - 02/08/15 1022    Visit Number 3   Number of Visits 12   Date for PT Re-Evaluation 03/14/15   PT Start Time 1016   PT Stop Time 1104   PT Time Calculation (min) 48 min   Activity Tolerance Patient limited by pain      Past Medical History  Diagnosis Date  . Hypercholesteremia   . Arthritis   . GERD (gastroesophageal reflux disease)     Past Surgical History  Procedure Laterality Date  . Abdominal hysterectomy    . Tubal ligation    . Breast cyst excision    . Right wrist surgery      There were no vitals filed for this visit.  Visit Diagnosis:  Tendonitis, Achilles, right  Generalized weakness  Swelling of joint  Stiffness of ankle joint, right  Pain in joint, ankle and foot, right      Subjective Assessment - 02/08/15 1022    Subjective Pt reported she had intense pain the night of last treatment and yesterday. Has been icing 1x/day.     Currently in Pain? Yes   Pain Score 5    Pain Location Ankle   Pain Orientation Left   Pain Descriptors / Indicators Aching;Dull                         [facility] Adult PT Treatment/Exercise - 02/08/15 0001    Modalities   Modalities Iontophoresis;Ultrasound   Manual Therapy   Manual Therapy Myofascial release   Myofascial Release to Rt gastroc/soleus    Ankle Exercises: Seated   BAPS Level 3  CW/CCW/ PF/DF   Other Seated Ankle Exercises PF with red band x 10 reps x 2 sets   Ankle Exercises: Aerobic   Stationary Bike NuStep L4 x 5 min (no boot)   Ankle Exercises: Stretches   Soleus Stretch 2 reps;30 seconds  longsitting   Gastroc Stretch 2  reps;30 seconds  long sitting    Ankle Exercises: Supine   Isometrics Rt inversion/eversion x 10 with 5 sec hold                  PT Short Term Goals - 01/31/15 1552    PT SHORT TERM GOAL #1   Title i wiht initial HEP   Time 2   Period Weeks   Status New   PT SHORT TERM GOAL #2   Title decreased pain by 25% with weightbearing   Time 2   Period Weeks   Status New           PT Long Term Goals - 01/31/15 1553    PT LONG TERM GOAL #1   Title I with advanced HEP   Time 6   Period Weeks   Status New   PT LONG TERM GOAL #2   Title decreased pain to 1/10 or less with weightbearing activities.   Time 6   Period Weeks   Status New   PT LONG TERM GOAL #3   Title demo full ankle ROM to normalize gait.   Time 6   Period Weeks   Status New  PT LONG TERM GOAL #4   Title ambulate with a normal gait pattern including stairs without CAM boot.   Time 6   Period Weeks   Status New   PT LONG TERM GOAL #5   Title demo 5/5 ankle strength to allow patient to drive.   Time 6   Period Weeks   Status New   Additional Long Term Goals   Additional Long Term Goals --   PT LONG TERM GOAL #6   Title Improve FOTO to 44%   Time 6   Period Weeks   Status New               Problem List Patient Active Problem List   Diagnosis Date Noted  . Breast lump 01/15/2015  . Achilles tendon sprain 01/15/2015  . Breast pain 01/15/2015  . Decreased potassium in the blood 01/12/2015  . Right ankle injury 11/21/2014  . 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 NASH (nonalcoholic steatohepatitis) 03/31/2014  . Calculus of kidney 03/31/2014  . Hemorrhoids, internal 02/08/2014  . DDD (degenerative disc disease), cervical 01/03/2014  . Acid reflux 01/03/2014  . Hypercholesteremia 11/30/2013  . Extreme obesity 11/30/2013  . Cardiac enlargement 07/22/2013  . 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 Allergic rhinitis  03/19/2013  . Chronic nonalcoholic liver disease 03/19/2013  . Generalized OA 03/19/2013  . Headache, migraine 03/19/2013  . Obstructive apnea 03/19/2013  . 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

## 2015-02-13 ENCOUNTER — Ambulatory Visit (INDEPENDENT_AMBULATORY_CARE_PROVIDER_SITE_OTHER): Payer: BC Managed Care – PPO | Admitting: Physical Therapy

## 2015-02-13 DIAGNOSIS — R531 Weakness: Secondary | ICD-10-CM | POA: Diagnosis not present

## 2015-02-13 DIAGNOSIS — M7661 Achilles tendinitis, right leg: Secondary | ICD-10-CM

## 2015-02-13 DIAGNOSIS — M25571 Pain in right ankle and joints of right foot: Secondary | ICD-10-CM

## 2015-02-13 DIAGNOSIS — M2540 Effusion, unspecified joint: Secondary | ICD-10-CM

## 2015-02-13 DIAGNOSIS — M25671 Stiffness of right ankle, not elsewhere classified: Secondary | ICD-10-CM | POA: Diagnosis not present

## 2015-02-13 NOTE — Patient Instructions (Addendum)
Forefoot Evertors   Place right foot flat on towel, knee pointed forward. Use forefoot and toes to push towel out to side. Do not allow heel or knee to move. Hold ____ seconds. Repeat ____ times. Do ____ sessions per day. CAUTION: Repetitions should be slow and controlled.  Forefoot Invertors   Place right foot flat on towel, knee pointed forward. Use forefoot and toes to pull towel in toward center. Do not allow heel or knee to move. Hold ____ seconds. Repeat ____ times. Do ____ sessions per day. CAUTION: Repetitions should be slow and controlled.   TOES: Towel Bunching   With involved straight toes on towel, bend toes bunching up towel ___ times. Do ___ times per day.  Chair Knee Flexion   Keeping feet on floor, slide foot of operated leg back, bending knee. Hold __15-30__ seconds. Repeat _3___ times. Do __2__ sessions a day.  [facility] [address] [place], [place] [address]  [phone] (office) [phone] (fax)

## 2015-02-13 NOTE — Therapy (Signed)
[facility] [address] [place], [place], [address] Phone: [phone]   Fax:  [phone]  Physical Therapy Treatment  Patient Details  Name: Sharon Lamb MRN: [id number] Date of Birth: 15-Apr-1961 Referring Provider:  Lenda KelpHudnall, Shane R, MD  Encounter Date: 02/13/2015      PT End of Session - 02/13/15 1019    Visit Number 4   Number of Visits 12   Date for PT Re-Evaluation 03/14/15   PT Start Time 1018   PT Stop Time 1104   PT Time Calculation (min) 46 min   Activity Tolerance Patient limited by pain      Past Medical History  Diagnosis Date  . Hypercholesteremia   . Arthritis   . GERD (gastroesophageal reflux disease)     Past Surgical History  Procedure Laterality Date  . Abdominal hysterectomy    . Tubal ligation    . Breast cyst excision    . Right wrist surgery      There were no vitals filed for this visit.  Visit Diagnosis:  Tendonitis, Achilles, right  Generalized weakness  Swelling of joint  Stiffness of ankle joint, right  Pain in joint, ankle and foot, right      Subjective Assessment - 02/13/15 1020    Subjective Pt reports pain had decreased after last session, wore boot a Roop less around house.  Following day, pain worsened.    Currently in Pain? Yes   Pain Score 6    Pain Location Ankle   Pain Orientation Left   Pain Descriptors / Indicators Sharp   Aggravating Factors  standing, walking    Pain Relieving Factors rest, ice               [facility] Adult PT Treatment/Exercise - 02/13/15 0001    Ankle Exercises: Seated   Towel Crunch 5 lengths of towel (challenging)   Towel Inversion/Eversion 4 lengths of towel, each way no weight.   Heel Slides Right;10 reps to stretch soleus   Other Seated Ankle Exercises PF/DF and inv/eversion with R foot on 1/2 foam roll Gastroc stretch with towel x 30 sec x 3 reps    Ankle Exercises: Aerobic   Stationary Bike NuStep L4 x 5 min  (no boot)           PT Education - 02/13/15 1304    Education provided Yes   Education Details HEP   Person(s) Educated Patient   Methods Explanation;Handout   Comprehension Verbalized understanding;Returned demonstration          PT Short Term Goals - 01/31/15 1552    PT SHORT TERM GOAL #1   Title i wiht initial HEP   Time 2   Period Weeks   Status New   PT SHORT TERM GOAL #2   Title decreased pain by 25% with weightbearing   Time 2   Period Weeks   Status New           PT Long Term Goals - 01/31/15 1553    PT LONG TERM GOAL #1   Title I with advanced HEP   Time 6   Period Weeks   Status New   PT LONG TERM GOAL #2   Title decreased pain to 1/10 or less with weightbearing activities.   Time 6   Period Weeks   Status New   PT LONG TERM GOAL #3   Title demo full ankle ROM to normalize gait.   Time 6   Period  Weeks   Status New   PT LONG TERM GOAL #4   Title ambulate with a normal gait pattern including stairs without CAM boot.   Time 6   Period Weeks   Status New   PT LONG TERM GOAL #5   Title demo 5/5 ankle strength to allow patient to drive.   Time 6   Period Weeks   Status New   Additional Long Term Goals   Additional Long Term Goals --   PT LONG TERM GOAL #6   Title Improve FOTO to 44%   Time 6   Period Weeks   Status New               Plan - 02/13/15 1256    Clinical Impression Statement Pt tolerated new exercises with minimal increase in pain. Pt very point tender during [place] to lateral aspect of Rt achilles tendon and medial distal gastroc.   No new goals yet.    Pt will benefit from skilled therapeutic intervention in order to improve on the following deficits Abnormal gait;Decreased range of motion;Impaired flexibility;Increased edema;Pain;Decreased mobility;Decreased strength   Rehab Potential Excellent   PT Frequency 2x / week   PT Duration 6 weeks   PT Treatment/Interventions ADLs/Self Care Home Management;Moist  Heat;Patient/family education;Passive range of motion;Therapeutic exercise;Ultrasound;Manual techniques;Neuromuscular re-education;Cryotherapy;Electrical Stimulation;Other (comment)   PT Next Visit Plan Continue progressive ROM/strengthening for Rt ankle.  Assess ankle ROM. Continue ionto/US.    Consulted and Agree with Plan of Care Patient        Problem List Patient Active Problem List   Diagnosis Date Noted  . Breast lump 01/15/2015  . Achilles tendon sprain 01/15/2015  . Breast pain 01/15/2015  . Decreased potassium in the blood 01/12/2015  . Right ankle injury 11/21/2014  . 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 NASH (nonalcoholic steatohepatitis) 03/31/2014  . Calculus of kidney 03/31/2014  . Hemorrhoids, internal 02/08/2014  . DDD (degenerative disc disease), cervical 01/03/2014  . Acid reflux 01/03/2014  . Hypercholesteremia 11/30/2013  . Extreme obesity 11/30/2013  . Cardiac enlargement 07/22/2013  . 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 Allergic rhinitis 03/19/2013  . Chronic nonalcoholic liver disease 03/19/2013  . Generalized OA 03/19/2013  . Headache, migraine 03/19/2013  . Obstructive apnea 03/19/2013  . 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 [place], [place], [address] Phone: [phone]   Fax:  [phone]

## 2015-02-15 ENCOUNTER — Encounter: Payer: Self-pay | Admitting: Physical Therapy

## 2015-02-15 ENCOUNTER — Telehealth: Payer: Self-pay | Admitting: Family Medicine

## 2015-02-15 NOTE — Telephone Encounter (Signed)
I'm uncomfortable with her driving given the severity of her symptoms - I'm afraid she wouldn't be able to slam on the brakes - the reason we have her on light duty restrictions from driving.  If she can get a ride late or on saturdays we could try one of the other PT places that have extended hours.

## 2015-02-16 ENCOUNTER — Encounter: Payer: Self-pay | Admitting: Physical Therapy

## 2015-02-19 ENCOUNTER — Ambulatory Visit (INDEPENDENT_AMBULATORY_CARE_PROVIDER_SITE_OTHER): Payer: BC Managed Care – PPO | Admitting: Physical Therapy

## 2015-02-19 DIAGNOSIS — M2540 Effusion, unspecified joint: Secondary | ICD-10-CM | POA: Diagnosis not present

## 2015-02-19 DIAGNOSIS — R531 Weakness: Secondary | ICD-10-CM

## 2015-02-19 DIAGNOSIS — M7661 Achilles tendinitis, right leg: Secondary | ICD-10-CM

## 2015-02-19 DIAGNOSIS — M25671 Stiffness of right ankle, not elsewhere classified: Secondary | ICD-10-CM

## 2015-02-19 DIAGNOSIS — M25571 Pain in right ankle and joints of right foot: Secondary | ICD-10-CM

## 2015-02-19 NOTE — Therapy (Signed)
[place], [place], [address] Phone: [phone]   Fax:  [phone]  Physical Therapy Treatment  Patient Details  Name: Sharon Lamb MRN: [id number] Date of Birth: [birth year] Referring Provider:  Dene Gentry, MD  Encounter Date: 02/19/2015      PT End of Session - 02/19/15 1107    Visit Number 5   Number of Visits 12   Date for PT Re-Evaluation 03/14/15   PT Start Time 3794   PT Stop Time 1101   PT Time Calculation (min) 46 min      Past Medical History  Diagnosis Date  . Hypercholesteremia   . Arthritis   . GERD (gastroesophageal reflux disease)     Past Surgical History  Procedure Laterality Date  . Abdominal hysterectomy    . Tubal ligation    . Breast cyst excision    . Right wrist surgery      There were no vitals filed for this visit.  Visit Diagnosis:  Tendonitis, Achilles, right  Generalized weakness  Swelling of joint  Pain in joint, ankle and foot, right  Stiffness of ankle joint, right      Subjective Assessment - 02/19/15 1018    Subjective Pt reports she is still wearing boot on/off throughout day. Pt reports ankle feels the same as last week.    Patient Stated Goals Get back to work, get rid of boot.    Currently in Pain? Yes   Pain Score 5    Pain Location Ankle   Pain Orientation Left   Pain Descriptors / Indicators Sharp   Aggravating Factors  walking, prolonged sitting    Pain Relieving Factors ice             [facility] PT Assessment - 02/19/15 0001    Assessment   Medical Diagnosis Achilles tendonitis of RLE   Onset Date 11/13/14   Next MD Visit 03/19/15   AROM   AROM Assessment Site Ankle   Right/Left Ankle Right   Right Ankle Dorsiflexion 2   Right Ankle Plantar Flexion 50   Right Ankle Inversion 24   Right Ankle Eversion 30   Strength   Strength Assessment Site Ankle   Right/Left Ankle Right   Right Ankle Dorsiflexion 5/5    Right Ankle Inversion --  5-/5, with pain    Right Ankle Eversion --  5-/5                     [facility] Adult PT Treatment/Exercise - 02/19/15 0001    Manual Therapy   Manual Therapy Myofascial release;Other (comment)   Myofascial Release to Rt gastroc/soleus    Other Manual Therapy TPR to Rt post tib (very point tender; difficulty tolerating);  dynamic tape applied to calcaneous up to mid calf    Ankle Exercises: Aerobic   Stationary Bike NuStep L4 x 5 min (no boot)   Ankle Exercises: Stretches   Gastroc Stretch 2 reps;30 seconds   Ankle Exercises: Seated   BAPS Sitting;Level 3  PF/DF, inv/eversion, CW/CCW   Ankle Exercises: Supine   T-Band Red band with inversion, eversion, and DF.                 PT Education - 02/19/15 1300    Education provided Yes   Education Details HEP- pt issued paper copy of ankle theraband exercises. Pt instructed to avoid RLE toe out during gait.    Person(s) Educated Patient  Methods Handout;Explanation;Demonstration   Comprehension Verbalized understanding          PT Short Term Goals - 01/31/15 1552    PT SHORT TERM GOAL #1   Title i wiht initial HEP   Time 2   Period Weeks   Status New   PT SHORT TERM GOAL #2   Title decreased pain by 25% with weightbearing   Time 2   Period Weeks   Status New           PT Long Term Goals - 01/31/15 1553    PT LONG TERM GOAL #1   Title I with advanced HEP   Time 6   Period Weeks   Status New   PT LONG TERM GOAL #2   Title decreased pain to 1/10 or less with weightbearing activities.   Time 6   Period Weeks   Status New   PT LONG TERM GOAL #3   Title demo full ankle ROM to normalize gait.   Time 6   Period Weeks   Status New   PT LONG TERM GOAL #4   Title ambulate with a normal gait pattern including stairs without CAM boot.   Time 6   Period Weeks   Status New   PT LONG TERM GOAL #5   Title demo 5/5 ankle strength to allow patient to drive.   Time 6   Period  Weeks   Status New   Additional Long Term Goals   Additional Long Term Goals --   PT LONG TERM GOAL #6   Title Improve FOTO to 44%   Time 6   Period Weeks   Status New               Plan - 02/19/15 1704    Clinical Impression Statement Pt able to tolerate exercise with theraband with minimal increase in pain.  Pt demonstrated improved Rt ankle DF.  Pt very point tender along post tib (ant shin) and continues with point tenderness lateral aspect of Achilles tendon. No new goals met yet.    Pt will benefit from skilled therapeutic intervention in order to improve on the following deficits Abnormal gait;Decreased range of motion;Impaired flexibility;Increased edema;Pain;Decreased mobility;Decreased strength   Rehab Potential Excellent   PT Frequency 2x / week   PT Duration 6 weeks   PT Treatment/Interventions ADLs/Self Care Home Management;Moist Heat;Patient/family education;Passive range of motion;Therapeutic exercise;Ultrasound;Manual techniques;Neuromuscular re-education;Cryotherapy;Electrical Stimulation;Other (comment)   PT Next Visit Plan Continue progressive ROM/strengthening for Rt ankle.  Assess response to dynamic tape and new HEP. Continue ionto/US.    Consulted and Agree with Plan of Care Patient        Problem List Patient Active Problem List   Diagnosis Date Noted  . Breast lump 01/15/2015  . Achilles tendon sprain 01/15/2015  . Breast pain 01/15/2015  . Decreased potassium in the blood 01/12/2015  . Right ankle injury 11/21/2014  . 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 NASH (nonalcoholic steatohepatitis) 03/31/2014  . Calculus of kidney 03/31/2014  . Hemorrhoids, internal 02/08/2014  . DDD (degenerative disc disease), cervical 01/03/2014  . Acid reflux 01/03/2014  . Hypercholesteremia 11/30/2013  . Extreme obesity 11/30/2013  . Cardiac enlargement 07/22/2013  . 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 Disease of tricuspid valve 07/19/2013   . Allergic rhinitis 03/19/2013  . Chronic nonalcoholic liver disease 50/53/9767  . Generalized OA 03/19/2013  . Headache, migraine 03/19/2013  . Obstructive apnea 03/19/2013  . 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

## 2015-02-20 ENCOUNTER — Encounter: Payer: Self-pay | Admitting: Physical Therapy

## 2015-02-22 ENCOUNTER — Ambulatory Visit (INDEPENDENT_AMBULATORY_CARE_PROVIDER_SITE_OTHER): Payer: BC Managed Care – PPO | Admitting: Physical Therapy

## 2015-02-22 DIAGNOSIS — M25571 Pain in right ankle and joints of right foot: Secondary | ICD-10-CM | POA: Diagnosis not present

## 2015-02-22 DIAGNOSIS — R531 Weakness: Secondary | ICD-10-CM

## 2015-02-22 DIAGNOSIS — M2540 Effusion, unspecified joint: Secondary | ICD-10-CM | POA: Diagnosis not present

## 2015-02-22 DIAGNOSIS — M7661 Achilles tendinitis, right leg: Secondary | ICD-10-CM | POA: Diagnosis not present

## 2015-02-22 DIAGNOSIS — M25671 Stiffness of right ankle, not elsewhere classified: Secondary | ICD-10-CM

## 2015-02-22 NOTE — Patient Instructions (Signed)
Instructions for Home exercise program:   As of 02/22/15- Use blue band for pointing foot down (plantar flex). 2 sets of 10-15.  Use green band for  inside, outside, and flexing foot (inversion, eversion, and dorsi flexion)  Standing on single leg next to counter for safety.  Work towards 15-30 sec each leg.   Mayer CamelJennifer Carlson-Long, PTA 02/22/2015 11:02 AM

## 2015-02-22 NOTE — Therapy (Signed)
[facility] [address] [place], [place], [address] Phone: [phone]   Fax:  [phone]  Physical Therapy Treatment  Patient Details  Name: Sharon Lamb MRN: [id number] Date of Birth: 06-Mar-1961 Referring Provider:  Lenda KelpHudnall, Shane R, MD  Encounter Date: 02/22/2015      PT End of Session - 02/22/15 1023    Visit Number 6   Number of Visits 12   Date for PT Re-Evaluation 03/14/15   PT Start Time 1021   PT Stop Time 1118   PT Time Calculation (min) 57 min      Past Medical History  Diagnosis Date  . Hypercholesteremia   . Arthritis   . GERD (gastroesophageal reflux disease)     Past Surgical History  Procedure Laterality Date  . Abdominal hysterectomy    . Tubal ligation    . Breast cyst excision    . Right wrist surgery      There were no vitals filed for this visit.  Visit Diagnosis:  Tendonitis, Achilles, right  Generalized weakness  Swelling of joint  Pain in joint, ankle and foot, right  Stiffness of ankle joint, right      Subjective Assessment - 02/22/15 1023    Subjective Pt reports she has had relief with use of dynamic tape.  Still complaining of increased pain with weight bearing.    Currently in Pain? Yes  (0/10 at rest)    Pain Score 5    Pain Location Ankle   Pain Orientation Left   Pain Descriptors / Indicators Sharp            [facility] PT Assessment - 02/22/15 0001    Assessment   Medical Diagnosis Achilles tendonitis of RLE   Onset Date 11/13/14   Next MD Visit 03/19/15                     [facility] Treatment/Exercise - 02/22/15 0001    Modalities   Modalities Iontophoresis;Cryotherapy   Iontophoresis   Type of Iontophoresis Dexamethasone   Location R achilles tendon   Dose 1.5cc   Time Hybresis x 12 min    Manual Therapy   Manual Therapy Other (comment)   Other Manual Therapy Dynamic tape applied to Rt heel to posterior calf to assist  achilles.    Ankle Exercises: Aerobic   Stationary Bike NuStep L5 x 5 min (no boot)   Ankle Exercises: Stretches   Gastroc Stretch 60 seconds;5 reps   Ankle Exercises: Seated   BAPS Sitting;Level 4;10 reps  Pf/DF, inv/ev, CW/CCW x 10 each   Ankle Exercises: Supine   T-Band green band inv, eversion, DF x 10 each; blue band PF 20 reps   Ankle Exercises: Standing   SLS 15 sec x 4 reps each leg (challenge for Rt)                   PT Short Term Goals - 01/31/15 1552    PT SHORT TERM GOAL #1   Title i wiht initial HEP   Time 2   Period Weeks   Status New   PT SHORT TERM GOAL #2   Title decreased pain by 25% with weightbearing   Time 2   Period Weeks   Status New           PT Long Term Goals - 01/31/15 1553    PT LONG TERM GOAL #1   Title I with advanced HEP  Time 6   Period Weeks   Status New   PT LONG TERM GOAL #2   Title decreased pain to 1/10 or less with weightbearing activities.   Time 6   Period Weeks   Status New   PT LONG TERM GOAL #3   Title demo full ankle ROM to normalize gait.   Time 6   Period Weeks   Status New   PT LONG TERM GOAL #4   Title ambulate with a normal gait pattern including stairs without CAM boot.   Time 6   Period Weeks   Status New   PT LONG TERM GOAL #5   Title demo 5/5 ankle strength to allow patient to drive.   Time 6   Period Weeks   Status New   Additional Long Term Goals   Additional Long Term Goals --   PT LONG TERM GOAL #6   Title Improve FOTO to 44%   Time 6   Period Weeks   Status New               Plan - 02/22/15 1302    Clinical Impression Statement Pt demonstrated improved strength, tolerating increased resistance with ther ex. Pt had difficult time tolerating the ionto with hybresis today due to increased pain(even with application of ice pack).  Pt progressing towards goals.    Pt will benefit from skilled therapeutic intervention in order to improve on the following deficits Abnormal  gait;Decreased range of motion;Impaired flexibility;Increased edema;Pain;Decreased mobility;Decreased strength   Rehab Potential Excellent   PT Frequency 2x / week   PT Duration 6 weeks   PT Treatment/Interventions ADLs/Self Care Home Management;Moist Heat;Patient/family education;Passive range of motion;Therapeutic exercise;Ultrasound;Manual techniques;Neuromuscular re-education;Cryotherapy;Electrical Stimulation;Other (comment)   PT Next Visit Plan Continue progressive ROM/strengthening for Rt ankle. Re-tape achilles tendon as needed.    Consulted and Agree with Plan of Care Patient        Problem List Patient Active Problem List   Diagnosis Date Noted  . Breast lump 01/15/2015  . Achilles tendon sprain 01/15/2015  . Breast pain 01/15/2015  . Decreased potassium in the blood 01/12/2015  . Right ankle injury 11/21/2014  . 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 NASH (nonalcoholic steatohepatitis) 03/31/2014  . Calculus of kidney 03/31/2014  . Hemorrhoids, internal 02/08/2014  . DDD (degenerative disc disease), cervical 01/03/2014  . Acid reflux 01/03/2014  . Hypercholesteremia 11/30/2013  . Extreme obesity 11/30/2013  . Cardiac enlargement 07/22/2013  . 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 Allergic rhinitis 03/19/2013  . Chronic nonalcoholic liver disease 03/19/2013  . Generalized OA 03/19/2013  . Headache, migraine 03/19/2013  . Obstructive apnea 03/19/2013  . 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

## 2015-02-27 ENCOUNTER — Ambulatory Visit (INDEPENDENT_AMBULATORY_CARE_PROVIDER_SITE_OTHER): Payer: BC Managed Care – PPO | Admitting: Physical Therapy

## 2015-02-27 DIAGNOSIS — M2540 Effusion, unspecified joint: Secondary | ICD-10-CM | POA: Diagnosis not present

## 2015-02-27 DIAGNOSIS — R531 Weakness: Secondary | ICD-10-CM | POA: Diagnosis not present

## 2015-02-27 DIAGNOSIS — M7661 Achilles tendinitis, right leg: Secondary | ICD-10-CM | POA: Diagnosis not present

## 2015-02-27 DIAGNOSIS — M25571 Pain in right ankle and joints of right foot: Secondary | ICD-10-CM

## 2015-02-27 DIAGNOSIS — M25671 Stiffness of right ankle, not elsewhere classified: Secondary | ICD-10-CM

## 2015-02-27 NOTE — Therapy (Signed)
[facility] [place], [place], [address] Phone: [phone]   Fax:  [phone]  Physical Therapy Treatment  Patient Details  Name: Sharon Lamb MRN: [id number] Date of Birth: [birth year] Referring Provider:  Dene Gentry, MD  Encounter Date: 02/27/2015      PT End of Session - 02/27/15 1002    Visit Number 7   Number of Visits 12   Date for PT Re-Evaluation 03/14/15   PT Start Time 1000   PT Stop Time 1118   PT Time Calculation (min) 78 min      Past Medical History  Diagnosis Date  . Hypercholesteremia   . Arthritis   . GERD (gastroesophageal reflux disease)     Past Surgical History  Procedure Laterality Date  . Abdominal hysterectomy    . Tubal ligation    . Breast cyst excision    . Right wrist surgery      There were no vitals filed for this visit.  Visit Diagnosis:  Tendonitis, Achilles, right  Generalized weakness  Swelling of joint  Pain in joint, ankle and foot, right  Stiffness of ankle joint, right      Subjective Assessment - 02/27/15 1003    Subjective Pt continues to report support/relief in Rt achilles with dynamic tape. Still c/o pain in weight bearing. She attributes increased pain in achilles to being up on feet more this past wkend, in boot.    Currently in Pain? Yes  (0/10 at rest)   Pain Score 6    Pain Location Ankle   Pain Orientation Left   Pain Descriptors / Indicators Sharp   Aggravating Factors  walking, stretch on achilles    Pain Relieving Factors ice             [facility] PT Assessment - 02/27/15 0001    Assessment   Medical Diagnosis Achilles tendonitis of RLE   Onset Date 11/13/14   Next MD Visit 03/19/15   AROM   AROM Assessment Site Ankle   Right/Left Ankle Right   Right Ankle Dorsiflexion 2   Right Ankle Plantar Flexion 50   Left Ankle Dorsiflexion 2                     [facility] Adult PT Treatment/Exercise - 02/27/15  0001    Modalities   Modalities Vasopneumatic;Ultrasound   Ultrasound   Ultrasound Location Rt achilles tendon (4 min to med/4 min to lateral)    Ultrasound Parameters 50% at 1.2 w/cm2, 3.3 mHz, 8 min    Ultrasound Goals Edema;Pain   Vasopneumatic   Number Minutes Vasopneumatic  12 minutes   Vasopnuematic Location  Ankle   Vasopneumatic Pressure Medium   Vasopneumatic Temperature  36 deg    Manual Therapy   Manual Therapy Taping;Myofascial release   Myofascial Release to R gastroc/soleus, Cross fiber to achilles,    Other Manual Therapy dynamic tape applied from Rt calcaneous to mid calf for support/decrease pain.    Ankle Exercises: Aerobic   Stationary Bike NuStep L5 x 5 min (no boot)   Ankle Exercises: Stretches   Plantar Fascia Stretch 1 rep;10 seconds  standing, gentle   Soleus Stretch 2 reps;30 seconds  standing, gentle   Gastroc Stretch 2 reps;30 seconds  standing -gentle   Ankle Exercises: Standing   SLS 20 sec each leg x 2 reps   Rocker Board 2 minutes  1/2 foam roller BLE   Ankle Exercises: Seated  BAPS Sitting;Level 4;10 reps  Pf/DF, inv/ev, CW/CCW x 10 each   Ankle Exercises: Supine   T-Band green band inv, eversion. DF + PF with blue band PF 20 reps of each                PT Education - 02/27/15 1022    Education provided Yes   Education Details HEP- pt instructed to decrease band exercise to 2x/every other day (instead of doing 3x every day).   Person(s) Educated Patient   Methods Explanation   Comprehension Verbalized understanding          PT Short Term Goals - 02/27/15 1023    PT SHORT TERM GOAL #1   Title i with initial HEP   Time 2   Period Weeks   Status Achieved   PT SHORT TERM GOAL #2   Title decreased pain by 25% with weightbearing   Time 2   Period Weeks   Status Achieved  reported 30% improvement.            PT Long Term Goals - 01/31/15 1553    PT LONG TERM GOAL #1   Title I with advanced HEP   Time 6   Period  Weeks   Status New   PT LONG TERM GOAL #2   Title decreased pain to 1/10 or less with weightbearing activities.   Time 6   Period Weeks   Status New   PT LONG TERM GOAL #3   Title demo full ankle ROM to normalize gait.   Time 6   Period Weeks   Status New   PT LONG TERM GOAL #4   Title ambulate with a normal gait pattern including stairs without CAM boot.   Time 6   Period Weeks   Status New   PT LONG TERM GOAL #5   Title demo 5/5 ankle strength to allow patient to drive.   Time 6   Period Weeks   Status New   Additional Long Term Goals   Additional Long Term Goals --   PT LONG TERM GOAL #6   Title Improve FOTO to 44%   Time 6   Period Weeks   Status New               Plan - 02/27/15 1118    Clinical Impression Statement Pt tolerated increased resistance with DF ther ex and also standing gastroc/soleus stretch. Pt continues to be point tender with manual/STW to Rt achilles, gastroc/soleus and distal post tib. Pt has met STG #1+2.    Pt will benefit from skilled therapeutic intervention in order to improve on the following deficits Abnormal gait;Decreased range of motion;Impaired flexibility;Increased edema;Pain;Decreased mobility;Decreased strength   Rehab Potential Excellent   PT Frequency 2x / week   PT Duration 6 weeks   PT Treatment/Interventions ADLs/Self Care Home Management;Moist Heat;Patient/family education;Passive range of motion;Therapeutic exercise;Ultrasound;Manual techniques;Neuromuscular re-education;Cryotherapy;Electrical Stimulation;Other (comment)   PT Next Visit Plan Continue progressive ROM/strengthening for Rt ankle. Re-tape achilles tendon as needed.    Consulted and Agree with Plan of Care Patient        Problem List Patient Active Problem List   Diagnosis Date Noted  . Breast lump 01/15/2015  . Achilles tendon sprain 01/15/2015  . Breast pain 01/15/2015  . Decreased potassium in the blood 01/12/2015  . Right ankle injury 11/21/2014   . 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 NASH (nonalcoholic steatohepatitis) 03/31/2014  . Calculus of kidney 03/31/2014  . Hemorrhoids, internal  02/08/2014  . DDD (degenerative disc disease), cervical 01/03/2014  . Acid reflux 01/03/2014  . Hypercholesteremia 11/30/2013  . Extreme obesity 11/30/2013  . Cardiac enlargement 07/22/2013  . 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 Allergic rhinitis 03/19/2013  . Chronic nonalcoholic liver disease 67/89/3810  . Generalized OA 03/19/2013  . Headache, migraine 03/19/2013  . Obstructive apnea 03/19/2013  . 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

## 2015-03-01 ENCOUNTER — Ambulatory Visit (INDEPENDENT_AMBULATORY_CARE_PROVIDER_SITE_OTHER): Payer: BC Managed Care – PPO | Admitting: Physical Therapy

## 2015-03-01 DIAGNOSIS — R531 Weakness: Secondary | ICD-10-CM

## 2015-03-01 DIAGNOSIS — M7661 Achilles tendinitis, right leg: Secondary | ICD-10-CM

## 2015-03-01 DIAGNOSIS — M25571 Pain in right ankle and joints of right foot: Secondary | ICD-10-CM

## 2015-03-01 DIAGNOSIS — M2540 Effusion, unspecified joint: Secondary | ICD-10-CM | POA: Diagnosis not present

## 2015-03-01 DIAGNOSIS — M25671 Stiffness of right ankle, not elsewhere classified: Secondary | ICD-10-CM

## 2015-03-01 NOTE — Patient Instructions (Signed)
Calf Stretch   Place hands on wall at shoulder height. Keeping back leg straight, bend front leg, feet pointing forward, heels flat on floor. Lean forward slightly until stretch is felt in calf of back leg. Hold stretch _30__ seconds, breathing slowly in and out. Repeat stretch with other leg back. Do _2-3__ sessions per day. Variation: Use chair or table for support. Calf Stretch (Soleus)   Gently bend knees slightly and move one foot back. Lean into wall so that stretch is felt in back lower calf. Hold _30___ seconds. Repeat with other leg back. Repeat __2__ times. Do _2-3___ sessions per day.   * Remember to perform cross fiber friction 3 min, 2-3 x / day to achilles tendon.  * try ice massage to same area following cross fiber friction.  * when performing the band exercise where you pulll toes towards you (with Dorinda Hillonald); bring toes back toward you and SLOWLY release toes towards Dorinda Hillonald.

## 2015-03-01 NOTE — Therapy (Signed)
[facility] [address] [place], [place], [address] Phone: [phone]   Fax:  [phone]  Physical Therapy Treatment  Patient Details  Name: Sharon Hastenatricia Henery MRN: [id number] Date of Birth: Sep 26, 1961 Referring Provider:  Lenda KelpHudnall, Shane R, MD  Encounter Date: 03/01/2015      PT End of Session - 03/01/15 1145    Visit Number 8   Number of Visits 12   Date for PT Re-Evaluation 03/14/15   PT Start Time 1143   PT Stop Time 1246   PT Time Calculation (min) 63 min   Activity Tolerance Patient limited by pain      Past Medical History  Diagnosis Date  . Hypercholesteremia   . Arthritis   . GERD (gastroesophageal reflux disease)     Past Surgical History  Procedure Laterality Date  . Abdominal hysterectomy    . Tubal ligation    . Breast cyst excision    . Right wrist surgery      There were no vitals filed for this visit.  Visit Diagnosis:  Tendonitis, Achilles, right  Generalized weakness  Swelling of joint  Pain in joint, ankle and foot, right  Stiffness of ankle joint, right      Subjective Assessment - 03/01/15 1148    Subjective Pt voices frustration; feels like ankle hasn't changed any. Rt Ankle feels worse than last session.    Currently in Pain? Yes   Pain Score 6    Pain Location Ankle            [facility] PT Assessment - 03/01/15 0001    Assessment   Medical Diagnosis Achilles tendonitis of RLE   Onset Date 11/13/14   Next MD Visit 03/19/15                     [facility] Adult [facility] Treatment/Exercise - 03/01/15 0001    Exercises   Exercises Ankle   Modalities   Modalities Iontophoresis;Ultrasound   Ultrasound   Ultrasound Location Rt achilles    Ultrasound Parameters 50% at 1.1 w/cm2, 3.3 mHz, 8 min    Ultrasound Goals Edema;Pain   Manual Therapy   Manual Therapy Joint mobilization;Soft tissue mobilization;Other (comment)   Joint Mobilization Rt post talus mobs grades 3-4.     Soft tissue mobilization cross fiber friction to Rt achilles tendon    Other Manual Therapy ice massage to Rt achilles and distal soleus x 5 min    Ankle Exercises: Stretches   Soleus Stretch 2 reps;30 seconds  long sitting, unable to tolerate standing   Gastroc Stretch 2 reps;30 seconds  long sitting    Ankle Exercises: Aerobic   Stationary Bike NuStep L4: 6 min   Ankle Exercises: Standing   SLS 20 sec each leg x 2 reps   Heel Raises 5 reps   Toe Raise 5 reps   Ankle Exercises: Seated   Heel Raises 15 reps  on 1/2 foam roll    Toe Raise 15 reps  on 1/2 foam roll   Other Seated Ankle Exercises 1/2 foam roll: side to side x 15 reps each direction    Ankle Exercises: Supine   T-Band green band inv, eversion. DF + PF with green  band PF 20 reps of each                PT Education - 03/01/15 1257    Education provided Yes   Education Details HEP-   Person(s) Educated Patient  Methods Handout;Explanation;Demonstration   Comprehension Verbalized understanding;Returned demonstration          PT Short Term Goals - 02/27/15 1023    PT SHORT TERM GOAL #1   Title i with initial HEP   Time 2   Period Weeks   Status Achieved   PT SHORT TERM GOAL #2   Title decreased pain by 25% with weightbearing   Time 2   Period Weeks   Status Achieved  reported 30% improvement.            PT Long Term Goals - 01/31/15 1553    PT LONG TERM GOAL #1   Title I with advanced HEP   Time 6   Period Weeks   Status New   PT LONG TERM GOAL #2   Title decreased pain to 1/10 or less with weightbearing activities.   Time 6   Period Weeks   Status New   PT LONG TERM GOAL #3   Title demo full ankle ROM to normalize gait.   Time 6   Period Weeks   Status New   PT LONG TERM GOAL #4   Title ambulate with a normal gait pattern including stairs without CAM boot.   Time 6   Period Weeks   Status New   PT LONG TERM GOAL #5   Title demo 5/5 ankle strength to allow patient to  drive.   Time 6   Period Weeks   Status New   Additional Long Term Goals   Additional Long Term Goals --   PT LONG TERM GOAL #6   Title Improve FOTO to 44%   Time 6   Period Weeks   Status New               Problem List Patient Active Problem List   Diagnosis Date Noted  . Breast lump 01/15/2015  . Achilles tendon sprain 01/15/2015  . Breast pain 01/15/2015  . Decreased potassium in the blood 01/12/2015  . Right ankle injury 11/21/2014  . 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 NASH (nonalcoholic steatohepatitis) 03/31/2014  . Calculus of kidney 03/31/2014  . Hemorrhoids, internal 02/08/2014  . DDD (degenerative disc disease), cervical 01/03/2014  . Acid reflux 01/03/2014  . Hypercholesteremia 11/30/2013  . Extreme obesity 11/30/2013  . Cardiac enlargement 07/22/2013  . 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 Allergic rhinitis 03/19/2013  . Chronic nonalcoholic liver disease 03/19/2013  . Generalized OA 03/19/2013  . Headache, migraine 03/19/2013  . Obstructive apnea 03/19/2013  . 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

## 2015-03-06 ENCOUNTER — Encounter: Payer: Self-pay | Admitting: Physical Therapy

## 2015-03-06 NOTE — Telephone Encounter (Signed)
Spoke to patient to find out what information she needed. Will get information together and will fax.

## 2015-03-08 ENCOUNTER — Encounter: Payer: Self-pay | Admitting: Rehabilitative and Restorative Service Providers"

## 2015-03-08 ENCOUNTER — Ambulatory Visit (INDEPENDENT_AMBULATORY_CARE_PROVIDER_SITE_OTHER): Payer: BC Managed Care – PPO | Admitting: Rehabilitative and Restorative Service Providers"

## 2015-03-08 DIAGNOSIS — M25571 Pain in right ankle and joints of right foot: Secondary | ICD-10-CM

## 2015-03-08 DIAGNOSIS — M7661 Achilles tendinitis, right leg: Secondary | ICD-10-CM

## 2015-03-08 DIAGNOSIS — M25671 Stiffness of right ankle, not elsewhere classified: Secondary | ICD-10-CM

## 2015-03-08 DIAGNOSIS — M2540 Effusion, unspecified joint: Secondary | ICD-10-CM | POA: Diagnosis not present

## 2015-03-08 DIAGNOSIS — R531 Weakness: Secondary | ICD-10-CM

## 2015-03-08 NOTE — Therapy (Signed)
[facility] [address] [place], [place], [address] Phone: [phone]   Fax:  [phone]  Physical Therapy Treatment  Patient Details  Name: Sharon Lamb MRN: [id number] Date of Birth: September 19, 1961 Referring Provider:  Lenda Kelp, MD  Encounter Date: 03/08/2015      PT End of Session - 03/08/15 1527    PT Stop Time 1527   Activity Tolerance Patient tolerated treatment well      Past Medical History  Diagnosis Date  . Hypercholesteremia   . Arthritis   . GERD (gastroesophageal reflux disease)     Past Surgical History  Procedure Laterality Date  . Abdominal hysterectomy    . Tubal ligation    . Breast cyst excision    . Right wrist surgery      There were no vitals filed for this visit.  Visit Diagnosis:  Tendonitis, Achilles, right  Generalized weakness  Swelling of joint  Pain in joint, ankle and foot, right  Stiffness of ankle joint, right      Subjective Assessment - 03/08/15 1413    Subjective Pt reports that ankle is some improved since beginning therapy. Doing exercises at home. sore from mobilization at last visit but it was resolved by the next day. working on massage and ice massage which hurts but she is working on it.   Currently in Pain? Yes   Pain Score 5    Pain Location Ankle   Pain Orientation Left;Right   Pain Descriptors / Indicators Sharp   Pain Type Chronic pain   Pain Onset More than a month ago                         [facility] Adult PT Treatment/Exercise - 03/08/15 0001    Exercises   Exercises Ankle   Cryotherapy   Number Minutes Cryotherapy 15 Minutes   Cryotherapy Location Ankle  and calf   Type of Cryotherapy Ice pack   Ultrasound   Ultrasound Location rt achilles and calf   Ultrasound Parameters 100% at 1.5 w/cm2 1.5 for 8 minutes   Ultrasound Goals Edema;Pain   Manual Therapy   Manual Therapy Joint mobilization;Soft tissue  mobilization;Other (comment)   Joint Mobilization Rt post talus mobs grades 3-4.    Other Manual Therapy deep tissue work through [employer]   Ankle Exercises: Stretches   Soleus Stretch 3 reps;20 seconds  corrected to keep heel down with stretch   Gastroc Stretch 2 reps;30 seconds  corrected to keep heel down with stretch   Other Stretch sitting gastroc/soleus stretch with strap - 3 reps 20 sec hold   Other Stretch hamstring/calf stretch supine 30 sec hold 3 reps   Ankle Exercises: Aerobic   Stationary Bike NuStep L4: 6 min   Ankle Exercises: Standing   SLS 20 sec each leg x 3 reps   Heel Raises 5 reps  5 reps   Heel Raises Limitations omitted toe raises    Ankle Exercises: Seated   BAPS --  Pf/DF, inv/ev, CW/CCW x 10 each   Other Seated Ankle Exercises 1/2 foam roll: side to side x 15 reps each direction    Ankle Exercises: Supine   T-Band green band inv, eversion. DF + PF with green  band PF 20 reps of each                PT Education - 03/08/15 1526    Education provided Yes   Education Details  corrected gastroc and soleus stretches; added hamsring stretch in supine; added massage to calf for home husbsnd to assist    Person(s) Educated Patient   Methods Explanation;Demonstration;Tactile cues;Verbal cues;Handout   Comprehension Verbalized understanding;Returned demonstration;Verbal cues required;Tactile cues required          PT Short Term Goals - 02/27/15 1023    PT SHORT TERM GOAL #1   Title i with initial HEP   Time 2   Period Weeks   Status Achieved   PT SHORT TERM GOAL #2   Title decreased pain by 25% with weightbearing   Time 2   Period Weeks   Status Achieved  reported 30% improvement.            PT Long Term Goals - 01/31/15 1553    PT LONG TERM GOAL #1   Title I with advanced HEP   Time 6   Period Weeks   Status New   PT LONG TERM GOAL #2   Title decreased pain to 1/10 or less with weightbearing activities.   Time 6   Period  Weeks   Status New   PT LONG TERM GOAL #3   Title demo full ankle ROM to normalize gait.   Time 6   Period Weeks   Status New   PT LONG TERM GOAL #4   Title ambulate with a normal gait pattern including stairs without CAM boot.   Time 6   Period Weeks   Status New   PT LONG TERM GOAL #5   Title demo 5/5 ankle strength to allow patient to drive.   Time 6   Period Weeks   Status New   Additional Long Term Goals   Additional Long Term Goals --   PT LONG TERM GOAL #6   Title Improve FOTO to 44%   Time 6   Period Weeks   Status New               Plan - 03/08/15 1529    Clinical Impression Statement Persistent calf/ankle/heel cord pain. Tolerated the additional activities well today. Will try to decrease stress to plantar flexors leaving off heel raises and TB PF - add home massage. Continue efforts.   Pt will benefit from skilled therapeutic intervention in order to improve on the following deficits Abnormal gait;Decreased range of motion;Impaired flexibility;Increased edema;Pain;Decreased mobility;Decreased strength   Rehab Potential Excellent   PT Frequency 2x / week   PT Duration 6 weeks   PT Treatment/Interventions ADLs/Self Care Home Management;Moist Heat;Patient/family education;Passive range of motion;Therapeutic exercise;Ultrasound;Manual techniques;Neuromuscular re-education;Cryotherapy;Electrical Stimulation;Other (comment)   PT Next Visit Plan Assess response to self massage through calf, trial of TENS which patient has at home    Consulted and Agree with Plan of Care Patient        Problem List Patient Active Problem List   Diagnosis Date Noted  . Breast lump 01/15/2015  . Achilles tendon sprain 01/15/2015  . Breast pain 01/15/2015  . Decreased potassium in the blood 01/12/2015  . Right ankle injury 11/21/2014  . 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 NASH (nonalcoholic steatohepatitis) 03/31/2014  . Calculus of kidney 03/31/2014  .  Hemorrhoids, internal 02/08/2014  . DDD (degenerative disc disease), cervical 01/03/2014  . Acid reflux 01/03/2014  . Hypercholesteremia 11/30/2013  . Extreme obesity 11/30/2013  . Cardiac enlargement 07/22/2013  . 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 Allergic rhinitis 03/19/2013  . Chronic nonalcoholic liver disease 03/19/2013  .  Generalized OA 03/19/2013  . Headache, migraine 03/19/2013  . Obstructive apnea 03/19/2013  . 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 [place], [place], [address] Phone: [phone]   Fax:  [phone]

## 2015-03-08 NOTE — Patient Instructions (Signed)
   Hamstring Step 4   Left knee bent - bring right leg up until you feel the stretch  Hold _30__ seconds. Repeat 3 reps 3-4 times/day   Use rolling pin to massage the calf 5-8 minutes/ 2-3 times/day to tolerance..Marland Kitchen

## 2015-03-13 ENCOUNTER — Ambulatory Visit (INDEPENDENT_AMBULATORY_CARE_PROVIDER_SITE_OTHER): Payer: BC Managed Care – PPO | Admitting: Physical Therapy

## 2015-03-13 DIAGNOSIS — M25571 Pain in right ankle and joints of right foot: Secondary | ICD-10-CM

## 2015-03-13 DIAGNOSIS — R531 Weakness: Secondary | ICD-10-CM | POA: Diagnosis not present

## 2015-03-13 DIAGNOSIS — M7661 Achilles tendinitis, right leg: Secondary | ICD-10-CM

## 2015-03-13 DIAGNOSIS — M25671 Stiffness of right ankle, not elsewhere classified: Secondary | ICD-10-CM

## 2015-03-13 NOTE — Therapy (Signed)
[facility] [address] [place], [place], [address] Phone: [phone]   Fax:  [phone]  Physical Therapy Treatment  Patient Details  Name: Sharon Lamb MRN: [id number] Date of Birth: 29-May-1961 Referring Provider:  Lenda KelpHudnall, Shane R, MD  Encounter Date: 03/13/2015      PT End of Session - 03/13/15 1231    Visit Number 10   Number of Visits 12   Date for PT Re-Evaluation 03/14/15   PT Start Time 1145   PT Stop Time 1227   PT Time Calculation (min) 42 min      Past Medical History  Diagnosis Date  . Hypercholesteremia   . Arthritis   . GERD (gastroesophageal reflux disease)     Past Surgical History  Procedure Laterality Date  . Abdominal hysterectomy    . Tubal ligation    . Breast cyst excision    . Right wrist surgery      There were no vitals filed for this visit.  Visit Diagnosis:  Tendonitis, Achilles, right  Generalized weakness  Pain in joint, ankle and foot, right  Stiffness of ankle joint, right      Subjective Assessment - 03/13/15 1150    Subjective Pt reports she has no pain in ankle at rest, just in weight bearing (up to 7/10).     Currently in Pain? Yes   Pain Score 5    Pain Location Ankle   Pain Orientation Right   Pain Descriptors / Indicators Sharp   Aggravating Factors  weight bearing    Pain Relieving Factors ice             [facility] PT Assessment - 03/13/15 0001    Assessment   Medical Diagnosis Achilles tendonitis of RLE   Onset Date/Surgical Date 11/13/14   Next MD Visit 03/19/15   AROM   AROM Assessment Site Ankle   Right/Left Ankle Right   Right Ankle Dorsiflexion 3   Right Ankle Plantar Flexion 52                     [facility] Adult PT Treatment/Exercise - 03/13/15 0001    Exercises   Exercises Ankle   Ultrasound   Ultrasound Location Rt achilles tendon    Ultrasound Parameters 50%, 3.3 mhz, 1.3 w/cm2 x 8min    Ultrasound Goals Edema;Pain    Manual Therapy   Other Manual Therapy ice massage to Rt achilles tendon x 5 min    Ankle Exercises: Stretches   Soleus Stretch 3 reps;30 seconds   Gastroc Stretch 2 reps;30 seconds   Ankle Exercises: Aerobic   Stationary Bike NuStep L4: 5 min   Ankle Exercises: Standing   SLS 30 sec x 2 reps    Heel Raises 5 reps  with toes on small rolled towel. x 4 sets   Heel Walk (Round Trip) x 16 ft with UE support                 PT Education - 03/13/15 1243    Education provided Yes   Education Details Added  heel raise on rolled towel in sets of 5 reps   Person(s) Educated Patient   Methods Explanation  declined handout.    Comprehension Verbalized understanding;Returned demonstration          PT Short Term Goals - 02/27/15 1023    PT SHORT TERM GOAL #1   Title i with initial HEP   Time 2   Period Weeks  Status Achieved   PT SHORT TERM GOAL #2   Title decreased pain by 25% with weightbearing   Time 2   Period Weeks   Status Achieved  reported 30% improvement.            PT Long Term Goals - 01/31/15 1553    PT LONG TERM GOAL #1   Title I with advanced HEP   Time 6   Period Weeks   Status New   PT LONG TERM GOAL #2   Title decreased pain to 1/10 or less with weightbearing activities.   Time 6   Period Weeks   Status New   PT LONG TERM GOAL #3   Title demo full ankle ROM to normalize gait.   Time 6   Period Weeks   Status New   PT LONG TERM GOAL #4   Title ambulate with a normal gait pattern including stairs without CAM boot.   Time 6   Period Weeks   Status New   PT LONG TERM GOAL #5   Title demo 5/5 ankle strength to allow patient to drive.   Time 6   Period Weeks   Status New   Additional Long Term Goals   Additional Long Term Goals --   PT LONG TERM GOAL #6   Title Improve FOTO to 44%   Time 6   Period Weeks   Status New               Plan - 03/13/15 1233    Clinical Impression Statement Pt's Rt ankle ROM remains unchanged  from previous assessment.  Pt tolerated heel raises with only minimal increase in pain. Pt continues with antalgic gait (in running shoes) and unchanged pain rating in WB.  Slow progress towards established goals.    Pt will benefit from skilled therapeutic intervention in order to improve on the following deficits Abnormal gait;Decreased range of motion;Impaired flexibility;Increased edema;Pain;Decreased mobility;Decreased strength   Rehab Potential Excellent   PT Frequency 2x / week   PT Duration 6 weeks   PT Treatment/Interventions ADLs/Self Care Home Management;Moist Heat;Patient/family education;Passive range of motion;Therapeutic exercise;Ultrasound;Manual techniques;Neuromuscular re-education;Cryotherapy;Electrical Stimulation;Other (comment)   PT Next Visit Plan Assess calf strength, have pt complete FOTO.  Write MD note.    Consulted and Agree with Plan of Care Patient        Problem List Patient Active Problem List   Diagnosis Date Noted  . Breast lump 01/15/2015  . Achilles tendon sprain 01/15/2015  . Breast pain 01/15/2015  . Decreased potassium in the blood 01/12/2015  . Right ankle injury 11/21/2014  . 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 NASH (nonalcoholic steatohepatitis) 03/31/2014  . Calculus of kidney 03/31/2014  . Hemorrhoids, internal 02/08/2014  . DDD (degenerative disc disease), cervical 01/03/2014  . Acid reflux 01/03/2014  . Hypercholesteremia 11/30/2013  . Extreme obesity 11/30/2013  . Cardiac enlargement 07/22/2013  . 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 Allergic rhinitis 03/19/2013  . Chronic nonalcoholic liver disease 03/19/2013  . Generalized OA 03/19/2013  . Headache, migraine 03/19/2013  . Obstructive apnea 03/19/2013  . 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  [address] [place], [place], [address] Phone: [phone]   Fax:  [phone]

## 2015-03-15 ENCOUNTER — Ambulatory Visit (INDEPENDENT_AMBULATORY_CARE_PROVIDER_SITE_OTHER): Payer: Worker's Compensation | Admitting: Physical Therapy

## 2015-03-15 DIAGNOSIS — M25571 Pain in right ankle and joints of right foot: Secondary | ICD-10-CM

## 2015-03-15 DIAGNOSIS — M7661 Achilles tendinitis, right leg: Secondary | ICD-10-CM | POA: Diagnosis not present

## 2015-03-15 DIAGNOSIS — R531 Weakness: Secondary | ICD-10-CM | POA: Diagnosis not present

## 2015-03-15 DIAGNOSIS — M2540 Effusion, unspecified joint: Secondary | ICD-10-CM

## 2015-03-15 DIAGNOSIS — M25671 Stiffness of right ankle, not elsewhere classified: Secondary | ICD-10-CM | POA: Diagnosis not present

## 2015-03-15 NOTE — Therapy (Addendum)
[place], [place], [address] Phone: [phone]   Fax:  [phone]  Physical Therapy Treatment  Patient Details  Name: Sharon Lamb MRN: [id number] Date of Birth: Sep 08, 1961 Referring Provider:  Dene Gentry, MD  Encounter Date: 03/15/2015      PT End of Session - 03/15/15 1108    Visit Number 11   Number of Visits 12   Date for PT Re-Evaluation 03/14/15   PT Start Time 1105   PT Stop Time 1146   PT Time Calculation (min) 41 min   Activity Tolerance Patient limited by pain      Past Medical History  Diagnosis Date  . Hypercholesteremia   . Arthritis   . GERD (gastroesophageal reflux disease)     Past Surgical History  Procedure Laterality Date  . Abdominal hysterectomy    . Tubal ligation    . Breast cyst excision    . Right wrist surgery      There were no vitals filed for this visit.  Visit Diagnosis:  Tendonitis, Achilles, right  Generalized weakness  Pain in joint, ankle and foot, right  Stiffness of ankle joint, right  Swelling of joint      Subjective Assessment - 03/15/15 1109    Subjective "It feels like when I walk on it, it's pulls and like something is going to break" (pt explaining her antalgic gait).  Pt reports her Achilles feels good during day but pain stirs at night.    Currently in Pain? Yes   Pain Score 6    Pain Location Ankle   Pain Orientation Right   Pain Descriptors / Indicators Sharp   Aggravating Factors  weight bearing, flexing Rt foot   Pain Relieving Factors ice             [facility] PT Assessment - 03/15/15 0001    Assessment   Medical Diagnosis Achilles tendonitis of RLE   Onset Date/Surgical Date 11/13/14   Next MD Visit 03/19/15   Strength   Overall Strength Comments Able to raise Lt heel up 4", Rt heel 2" with pain.    Strength Assessment Site Ankle   Right/Left Ankle Right                     [facility] Adult PT  Treatment/Exercise - 03/15/15 0001    Modalities   Modalities Cryotherapy;Ultrasound   Ultrasound   Ultrasound Location Rt achilles    Ultrasound Parameters 50%, 3.3 mHz, 1.3 w/cm2 x 8 min    Ultrasound Goals Edema;Pain   Manual Therapy   Other Manual Therapy ice massage to Rt achilles tendon x 4 min    Ankle Exercises: Aerobic   Stationary Bike NuStep L5: 5 min   Ankle Exercises: Seated   BAPS Level 4;Sitting;10 reps  forward/backward, side/side, and cw/ccw x 10 each   Ankle Exercises: Stretches   Soleus Stretch 3 reps;30 seconds   Gastroc Stretch 2 reps;30 seconds   Ankle Exercises: Standing   Heel Raises 5 reps  with toes on small rolled towel. x 4 sets   Other Standing Ankle Exercises SLS on blue foam x 3 trials of 30 sec (occasional UE for support)                  PT Short Term Goals - 02/27/15 1023    PT SHORT TERM GOAL #1   Title i with initial HEP   Time 2   Period  Weeks   Status Achieved   PT SHORT TERM GOAL #2   Title decreased pain by 25% with weightbearing   Time 2   Period Weeks   Status Achieved  reported 30% improvement.            PT Long Term Goals - 03/15/15 1822    PT LONG TERM GOAL #1   Title I with advanced HEP   Time 6   Period Weeks   Status Partially Met   PT LONG TERM GOAL #2   Title decreased pain to 1/10 or less with weightbearing activities.   Time 6   Period Weeks   Status On-going   PT LONG TERM GOAL #3   Title demo full ankle ROM to normalize gait.   Time 6   Period Weeks   Status On-going   PT LONG TERM GOAL #4   Title ambulate with a normal gait pattern including stairs without CAM boot.   Time 6   Period Weeks   Status On-going   PT LONG TERM GOAL #5   Title demo 5/5 ankle strength to allow patient to drive.   Time 6   Period Weeks   Status Partially Met   PT LONG TERM GOAL #6   Title Improve FOTO to 44%   Time 6   Period Weeks   Status On-going               Plan - 03/15/15 1156     Clinical Impression Statement Pt's Rt ankle ROM remains limited. Pt demo good strength with all ankle directions except plantar flexion; unable to tolerate MMT break test.  Pt has received 6 ionto treatments, as well as manual therapy and [place], without improvement of symptoms. Pt has made limited progress towards goals.     Pt will benefit from skilled therapeutic intervention in order to improve on the following deficits Abnormal gait;Decreased range of motion;Impaired flexibility;Increased edema;Pain;Decreased mobility;Decreased strength   Rehab Potential Excellent   PT Frequency 2x / week   PT Duration 6 weeks   PT Treatment/Interventions ADLs/Self Care Home Management;Moist Heat;Patient/family education;Passive range of motion;Therapeutic exercise;Ultrasound;Manual techniques;Neuromuscular re-education;Cryotherapy;Electrical Stimulation;Other (comment)   PT Next Visit Plan Pt to return to MD 03/20/15. Will await MD's instructions, whether to continue therapy vs d/c.    Consulted and Agree with Plan of Care Patient        Problem List Patient Active Problem List   Diagnosis Date Noted  . Breast lump 01/15/2015  . Achilles tendon sprain 01/15/2015  . Breast pain 01/15/2015  . Decreased potassium in the blood 01/12/2015  . Right ankle injury 11/21/2014  . 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 NASH (nonalcoholic steatohepatitis) 03/31/2014  . Calculus of kidney 03/31/2014  . Hemorrhoids, internal 02/08/2014  . DDD (degenerative disc disease), cervical 01/03/2014  . Acid reflux 01/03/2014  . Hypercholesteremia 11/30/2013  . Extreme obesity 11/30/2013  . Cardiac enlargement 07/22/2013  . 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 Allergic rhinitis 03/19/2013  . Chronic nonalcoholic liver disease 07/62/2633  . Generalized OA 03/19/2013  . Headache, migraine 03/19/2013  . Obstructive apnea 03/19/2013  . 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  Patient goals were partially met. Patient is being discharged due to a change in medical status. Pt being referred to an orthopedic MD  ?????   Jeral Pinch, PT 03/22/2015 7:57 AM

## 2015-03-19 ENCOUNTER — Encounter: Payer: Self-pay | Admitting: Family Medicine

## 2015-03-19 ENCOUNTER — Ambulatory Visit (INDEPENDENT_AMBULATORY_CARE_PROVIDER_SITE_OTHER): Payer: Worker's Compensation | Admitting: Family Medicine

## 2015-03-19 VITALS — BP 128/83 | HR 57 | Ht 65.0 in

## 2015-03-19 DIAGNOSIS — M25571 Pain in right ankle and joints of right foot: Secondary | ICD-10-CM | POA: Diagnosis not present

## 2015-03-19 DIAGNOSIS — S99911D Unspecified injury of right ankle, subsequent encounter: Secondary | ICD-10-CM

## 2015-03-19 NOTE — Patient Instructions (Signed)
Given your lack of improvement with the conservative measures at this point I would recommend referral to an orthopedic surgeon to discuss risks/benefits, recovery for possible surgery. Continue with the boot in the meantime. Continue home exercises as well.

## 2015-03-20 NOTE — Assessment & Plan Note (Signed)
2/2 strain of achilles tendon with severe tendinitis.  Despite PT, nitro patches, home exercises, cam walker, heel lifts she continues to struggle with this after 4 months of conservative treatment.  Will refer to orthopedics to discuss possible surgical intervention.

## 2015-03-20 NOTE — Progress Notes (Signed)
PCP: Agustina CaroliHICKS, KRISTIN D, MD  Subjective:   HPI: Patient is a 54 y.o. female here for right ankle injury.  2/8: Patient reports on 2/1 at work she stepped wrong on a rock when turning to answer a question and inverted her right ankle. Has a remote sprain here but no problems since then. Pain currently lateral ankle and posteriorly. No swelling or bruising.  2/22: Patient reports she feels the same or worse than last visit. Could not tolerated heel lifts - didn't seem to make her better. 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 Doing basic motion exercises.  3/21: Patient reports her workers comp claim was denied. Physical therapy has not yet called her as a result (was referred thru workers comp). Pain is 7/10 level. Using cam walker without heel lifts. Using nitro patches though getting lingering headaches with these so not using every day. Doing some home exercises.  4/25: Patient reports she feels the same. Pain level 6/10. Has only done one visit of therapy to date unfortunately. Using nitro patches, doing home exercises. Using cam walker with a lift.  6/6: Patient reports she feels about the same despite 11 PT visits. Doing home exercises, was using nitro though recently stopped by PT recommendations. No swelling. Still using boot with lift.  Past Medical History  Diagnosis Date  . Hypercholesteremia   . Arthritis   . GERD (gastroesophageal reflux disease)     Current Outpatient Prescriptions on File Prior to Visit  Medication Sig Dispense Refill  . celecoxib (CELEBREX) 200 MG capsule   5  . dextromethorphan-guaiFENesin (MUCINEX DM) 30-600 MG per 12 hr tablet Take 1 tablet by mouth 2 (two) times daily. (Patient not taking: Reported on 01/31/2015) 14 tablet 1  . esomeprazole (NEXIUM) 40 MG capsule TAKE ONE CAPSULE BY MOUTH TWICE DAILY 30 MINUTES BEFORE BREAKFAST AND DINNER    . estradiol (ESTRACE) 2 MG tablet TAKE 1 TABLET BY MOUTH  DAILY FOR MENOPAUSE** STOP THE 1 MG TABLET**    . fluticasone (FLONASE) 50 MCG/ACT nasal spray Place 2 sprays into both nostrils daily. 16 g 0  . gabapentin (NEURONTIN) 400 MG capsule TAKE 2 CAPSULES BY MOUTH FOUR TIMES DAILY AS NEEDED FOR PAIN    . montelukast (SINGULAIR) 10 MG tablet Take 10 mg by mouth at bedtime.    . nitroGLYCERIN (NITRODUR - DOSED IN MG/24 HR) 0.2 mg/hr patch Apply 1/4th patch to affected achilles, change daily 30 patch 1  . oxyCODONE-acetaminophen (PERCOCET) 10-325 MG per tablet   0  . simvastatin (ZOCOR) 40 MG tablet   4  . topiramate (TOPAMAX) 25 MG tablet Frequency:   Dosage:0   MG  Instructions:  Note:TAKE ONE TABLET BY MOUTH TWICE DAILY PRN     No current facility-administered medications on file prior to visit.    Past Surgical History  Procedure Laterality Date  . Abdominal hysterectomy    . Tubal ligation    . Breast cyst excision    . Right wrist surgery      Allergies  Allergen Reactions  . Estradiol Other (See Comments)    Headache   . Tetracyclines & Related   . Fentanyl Palpitations    Patch    History   Social History  . Marital Status: Married    Spouse Name: N/A  . Number of Children: N/A  . Years of Education: N/A   Occupational History  . Not on file.   Social History Main Topics  .  Smoking status: Never Smoker   . Smokeless tobacco: Not on file  . Alcohol Use: No  . Drug Use: No  . Sexual Activity: Not on file   Other Topics Concern  . Not on file   Social History Narrative    No family history on file.  BP 128/83 mmHg  Pulse 57  Ht  (1.651 m)  Review of Systems: See HPI above.    Objective:  Physical Exam:  Gen: NAD  Right ankle: No gross deformity, swelling, ecchymoses FROM with pain on calf raise only. TTP insertion of achilles on calcaneus.  No other tenderness.  No focal bony tenderness. Negative ant drawer and talar tilt.   Negative syndesmotic compression. Thompsons test negative. NV  intact distally.    Assessment & Plan:  1. Right ankle injury - 2/2 strain of achilles tendon with severe tendinitis.  Despite PT, nitro patches, home exercises, cam walker, heel lifts she continues to struggle with this after 4 months of conservative treatment.  Will refer to orthopedics to discuss possible surgical intervention.

## 2015-09-12 ENCOUNTER — Encounter (HOSPITAL_BASED_OUTPATIENT_CLINIC_OR_DEPARTMENT_OTHER): Payer: Self-pay | Admitting: *Deleted

## 2015-09-12 ENCOUNTER — Emergency Department (HOSPITAL_BASED_OUTPATIENT_CLINIC_OR_DEPARTMENT_OTHER)
Admission: EM | Admit: 2015-09-12 | Discharge: 2015-09-12 | Disposition: A | Discharge status: Home / Self Care | Payer: BC Managed Care – PPO | Attending: Emergency Medicine | Admitting: Emergency Medicine

## 2015-09-12 DIAGNOSIS — M542 Cervicalgia: Secondary | ICD-10-CM | POA: Insufficient documentation

## 2015-09-12 DIAGNOSIS — Z79899 Other long term (current) drug therapy: Secondary | ICD-10-CM | POA: Insufficient documentation

## 2015-09-12 DIAGNOSIS — K219 Gastro-esophageal reflux disease without esophagitis: Secondary | ICD-10-CM | POA: Diagnosis not present

## 2015-09-12 DIAGNOSIS — R05 Cough: Secondary | ICD-10-CM | POA: Diagnosis present

## 2015-09-12 DIAGNOSIS — E7800 Pure hypercholesterolemia, unspecified: Secondary | ICD-10-CM | POA: Diagnosis not present

## 2015-09-12 DIAGNOSIS — J069 Acute upper respiratory infection, unspecified: Secondary | ICD-10-CM

## 2015-09-12 DIAGNOSIS — M1990 Unspecified osteoarthritis, unspecified site: Secondary | ICD-10-CM | POA: Insufficient documentation

## 2015-09-12 DIAGNOSIS — Z7951 Long term (current) use of inhaled steroids: Secondary | ICD-10-CM | POA: Diagnosis not present

## 2015-09-12 DIAGNOSIS — R059 Cough, unspecified: Secondary | ICD-10-CM

## 2015-09-12 MED ORDER — GUAIFENESIN-CODEINE 100-10 MG/5ML PO SOLN: 10.0000 mL | Freq: Four times a day (QID) | ORAL | Status: DC | PRN

## 2015-09-12 NOTE — Discharge Instructions (Signed)
Robitussin with codeine as prescribed as needed for cough.  Follow-up with your primary Dr. if not improving in the next week, and return to the ER symptoms significantly worsen or change.   Cough, Adult Coughing is a reflex that clears your throat and your airways. Coughing helps to heal and protect your lungs. It is normal to cough occasionally, but a cough that happens with other symptoms or lasts a long time may be a sign of a condition that needs treatment. A cough may last only 2-3 weeks (acute), or it may last longer than 8 weeks (chronic). CAUSES Coughing is commonly caused by:  Breathing in substances that irritate your lungs.  A viral or bacterial respiratory infection.  Allergies.  Asthma.  Postnasal drip.  Smoking.  Acid backing up from the stomach into the esophagus (gastroesophageal reflux).  Certain medicines.  Chronic lung problems, including COPD (or rarely, lung cancer).  Other medical conditions such as heart failure. HOME CARE INSTRUCTIONS  Pay attention to any changes in your symptoms. Take these actions to help with your discomfort:  Take medicines only as told by your health care provider.  If you were prescribed an antibiotic medicine, take it as told by your health care provider. Do not stop taking the antibiotic even if you start to feel better.  Talk with your health care provider before you take a cough suppressant medicine.  Drink enough fluid to keep your urine clear or pale yellow.  If the air is dry, use a cold steam vaporizer or humidifier in your bedroom or your home to help loosen secretions.  Avoid anything that causes you to cough at work or at home.  If your cough is worse at night, try sleeping in a semi-upright position.  Avoid cigarette smoke. If you smoke, quit smoking. If you need help quitting, ask your health care provider.  Avoid caffeine.  Avoid alcohol.  Rest as needed. SEEK MEDICAL CARE IF:   You have new  symptoms.  You cough up pus.  Your cough does not get better after 2-3 weeks, or your cough gets worse.  You cannot control your cough with suppressant medicines and you are losing sleep.  You develop pain that is getting worse or pain that is not controlled with pain medicines.  You have a fever.  You have unexplained weight loss.  You have night sweats. SEEK IMMEDIATE MEDICAL CARE IF:  You cough up blood.  You have difficulty breathing.  Your heartbeat is very fast.   This information is not intended to replace advice given to you by your health care provider. Make sure you discuss any questions you have with your health care provider.   Document Released: 03/28/2011 Document Revised: 06/20/2015 Document Reviewed: 12/06/2014 Elsevier Interactive Patient Education [employer]2016 [employer]

## 2015-09-12 NOTE — ED Notes (Addendum)
Non-productive cough since Monday. C/o neck pain and spasm also

## 2015-09-12 NOTE — ED Provider Notes (Signed)
CSN: [id number]     Arrival date & time 09/12/15  1745 History  By signing my name below, I, Soijett Blue, attest that this documentation has been prepared under the direction and in the presence of Geoffery Lyonsouglas Sakura Denis, MD. Electronically Signed: Soijett Blue, ED Scribe. 09/12/2015. 6:36 PM.   Chief Complaint  Patient presents with  . Cough      Patient is a 54 y.o. female presenting with cough. The history is provided by the patient. No language interpreter was used.  Cough Cough characteristics:  Non-productive Severity:  Moderate Onset quality:  Gradual Duration:  3 days Timing:  Constant Progression:  Unchanged Chronicity:  New Smoker: no   Context: not sick contacts   Relieved by:  None tried Worsened by:  Nothing tried Ineffective treatments:  None tried Associated symptoms: chest pain   Associated symptoms: no fever and no shortness of breath     Clearnce Hastenatricia Waskey is a 54 y.o. female who presents to the Emergency Department complaining of persistent cough onset 3 days. She states that she is having associated symptoms of CP and neck soreness. She states that she has not tried any medications for the relief for her symptoms. She denies fever, SOB, and any other symptoms. Denies sick contacts. Denies medical hx of asthma, DM, or HTN. Denies smoking cigarettes at this time.    Past Medical History  Diagnosis Date  . Hypercholesteremia   . Arthritis   . GERD (gastroesophageal reflux disease)    Past Surgical History  Procedure Laterality Date  . Abdominal hysterectomy    . Tubal ligation    . Breast cyst excision    . Right wrist surgery     No family history on file. Social History  Substance Use Topics  . Smoking status: Never Smoker   . Smokeless tobacco: Never Used  . Alcohol Use: No   OB History    No data available     Review of Systems  Constitutional: Negative for fever.  Respiratory: Positive for cough. Negative for shortness of breath.   Cardiovascular:  Positive for chest pain.  All other systems reviewed and are negative.     Allergies  Estradiol; Tetracyclines & related; and Fentanyl  Home Medications   Prior to Admission medications   Medication Sig Start Date End Date Taking? Authorizing Provider  ENJUVIA 0.9 MG tablet  03/14/15  Yes Historical Provider, MD  esomeprazole (NEXIUM) 40 MG capsule TAKE ONE CAPSULE BY MOUTH TWICE DAILY 30 MINUTES BEFORE BREAKFAST AND DINNER 01/01/15  Yes Historical Provider, MD  fluticasone (FLONASE) 50 MCG/ACT nasal spray Place 2 sprays into both nostrils daily. 11/09/14  Yes Kristen N Ward, DO  gabapentin (NEURONTIN) 400 MG capsule TAKE 2 CAPSULES BY MOUTH FOUR TIMES DAILY AS NEEDED FOR PAIN 01/09/15  Yes Historical Provider, MD  montelukast (SINGULAIR) 10 MG tablet Take 10 mg by mouth at bedtime.   Yes Historical Provider, MD  oxyCODONE-acetaminophen (PERCOCET) 10-325 MG per tablet  12/14/14  Yes Historical Provider, MD  simvastatin (ZOCOR) 40 MG tablet  12/28/14  Yes Historical Provider, MD  topiramate (TOPAMAX) 25 MG tablet Frequency:   Dosage:0   MG  Instructions:  Note:TAKE ONE TABLET BY MOUTH TWICE DAILY PRN 08/20/11  Yes Historical Provider, MD  celecoxib (CELEBREX) 200 MG capsule  12/28/14   Historical Provider, MD  dextromethorphan-guaiFENesin (MUCINEX DM) 30-600 MG per 12 hr tablet Take 1 tablet by mouth 2 (two) times daily. Patient not taking: Reported on 01/31/2015 06/22/14   Lorin PicketScott  Zackowski, MD  estradiol (ESTRACE) 2 MG tablet TAKE 1 TABLET BY MOUTH DAILY FOR MENOPAUSE** STOP THE 1 MG TABLET** 12/04/14 06/02/15  Historical Provider, MD  guaiFENesin-codeine 100-10 MG/5ML syrup Take 10 mLs by mouth every 6 (six) hours as needed for cough. 09/12/15   Geoffery Lyons, MD  nitroGLYCERIN (NITRODUR - DOSED IN MG/24 HR) 0.2 mg/hr patch Apply 1/4th patch to affected achilles, change daily 12/04/14   Lenda Kelp, MD   BP 139/77 mmHg  Pulse 55  Temp(Src) 98.2 F (36.8 C) (Oral)  Resp 18  SpO2 100% Physical  Exam  Constitutional: She is oriented to person, place, and time. She appears well-developed and well-nourished. No distress.  HENT:  Head: Normocephalic and atraumatic.  Mouth/Throat: Oropharynx is clear and moist.  Eyes: EOM are normal.  Neck: Neck supple.  Cardiovascular: Normal rate, regular rhythm and normal heart sounds.  Exam reveals no gallop and no friction rub.   No murmur heard. Pulmonary/Chest: Effort normal and breath sounds normal. No respiratory distress. She has no wheezes. She has no rales.  Musculoskeletal: Normal range of motion.  Neurological: She is alert and oriented to person, place, and time.  Skin: Skin is warm and dry. She is not diaphoretic.  Psychiatric: She has a normal mood and affect. Her behavior is normal.  Nursing note and vitals reviewed.   ED Course  Procedures (including critical care time) DIAGNOSTIC STUDIES: Oxygen Saturation is 100% on RA, nl by my interpretation.    COORDINATION OF CARE: 6:35 PM Discussed treatment plan with pt at bedside and pt agreed to plan.    Labs Review Labs Reviewed - No data to display  Imaging Review No results found.    EKG Interpretation None      MDM   Final diagnoses:  Cough  URI (upper respiratory infection)    Symptoms likely viral in nature. Will prescribe cough syrup and recommended when necessary return. There is no hypoxia, respiratory distress, or other focal finding on her exam.  I personally performed the services described in this documentation, which was scribed in my presence. The recorded information has been reviewed and is accurate.      Geoffery Lyons, MD 09/12/15 [phone]

## 2015-10-29 ENCOUNTER — Encounter (HOSPITAL_BASED_OUTPATIENT_CLINIC_OR_DEPARTMENT_OTHER): Payer: Self-pay | Admitting: *Deleted

## 2015-10-29 ENCOUNTER — Emergency Department (HOSPITAL_BASED_OUTPATIENT_CLINIC_OR_DEPARTMENT_OTHER)
Admission: EM | Admit: 2015-10-29 | Discharge: 2015-10-29 | Disposition: A | Discharge status: Home / Self Care | Payer: BC Managed Care – PPO | Attending: Emergency Medicine | Admitting: Emergency Medicine

## 2015-10-29 DIAGNOSIS — R202 Paresthesia of skin: Secondary | ICD-10-CM | POA: Diagnosis not present

## 2015-10-29 DIAGNOSIS — Z79899 Other long term (current) drug therapy: Secondary | ICD-10-CM | POA: Insufficient documentation

## 2015-10-29 DIAGNOSIS — Z7951 Long term (current) use of inhaled steroids: Secondary | ICD-10-CM | POA: Insufficient documentation

## 2015-10-29 DIAGNOSIS — R519 Headache, unspecified: Secondary | ICD-10-CM

## 2015-10-29 DIAGNOSIS — R51 Headache: Secondary | ICD-10-CM | POA: Insufficient documentation

## 2015-10-29 DIAGNOSIS — M1990 Unspecified osteoarthritis, unspecified site: Secondary | ICD-10-CM | POA: Insufficient documentation

## 2015-10-29 DIAGNOSIS — K219 Gastro-esophageal reflux disease without esophagitis: Secondary | ICD-10-CM | POA: Insufficient documentation

## 2015-10-29 DIAGNOSIS — E7800 Pure hypercholesterolemia, unspecified: Secondary | ICD-10-CM | POA: Insufficient documentation

## 2015-10-29 HISTORY — DX: Migraine, unspecified, not intractable, without status migrainosus: G43.909

## 2015-10-29 MED ORDER — CYCLOBENZAPRINE HCL 10 MG PO TABS
10.0000 mg | ORAL_TABLET | Freq: Once | ORAL | Status: AC
  Administered 2015-10-29: 10 mg via ORAL
  Filled 2015-10-29: qty 1

## 2015-10-29 MED ORDER — IBUPROFEN 800 MG PO TABS: 800.0000 mg | ORAL_TABLET | Freq: Three times a day (TID) | ORAL | Status: DC | PRN

## 2015-10-29 MED ORDER — DIAZEPAM 5 MG PO TABS: 5.0000 mg | ORAL_TABLET | Freq: Three times a day (TID) | ORAL | Status: DC | PRN

## 2015-10-29 MED ORDER — KETOROLAC TROMETHAMINE 60 MG/2ML IM SOLN
60.0000 mg | Freq: Once | INTRAMUSCULAR | Status: AC
  Administered 2015-10-29: 60 mg via INTRAMUSCULAR
  Filled 2015-10-29: qty 2

## 2015-10-29 NOTE — Discharge Instructions (Signed)
Read the information below.  Use the prescribed medication as directed.  Please discuss all new medications with your pharmacist.  You may return to the Emergency Department at any time for worsening condition or any new symptoms that concern you.   ° °You are having a headache. No specific cause was found today for your headache. It may have been a migraine or other cause of headache. Stress, anxiety, fatigue, and depression are common triggers for headaches. Your headache today does not appear to be life-threatening or require hospitalization, but often the exact cause of headaches is not determined in the emergency department. Therefore, follow-up with your doctor is very important to find out what may have caused your headache, and whether or not you need any further diagnostic testing or treatment. Sometimes headaches can appear benign (not harmful), but then more serious symptoms can develop which should prompt an immediate re-evaluation by your doctor or the emergency department. °SEEK MEDICAL ATTENTION IF: °You develop possible problems with medications prescribed.  °The medications don't resolve your headache, if it recurs , or if you have multiple episodes of vomiting or can't take fluids. °You have a change from the usual headache. °RETURN IMMEDIATELY IF you develop a sudden, severe headache or confusion, become poorly responsive or faint, develop a fever above 100.4F or problem breathing, have a change in speech, vision, swallowing, or understanding, or develop new weakness, numbness, tingling, incoordination, or have a seizure. ° ° °General Headache Without Cause °A headache is pain or discomfort felt around the head or neck area. The specific cause of a headache may not be found. There are many causes and types of headaches. A few common ones are: °· Tension headaches. °· Migraine headaches. °· Cluster headaches. °· Chronic daily headaches. °HOME CARE INSTRUCTIONS  °Watch your condition for any  changes. Take these steps to help with your condition: °Managing Pain °· Take over-the-counter and prescription medicines only as told by your health care provider. °· Lie down in a dark, quiet room when you have a headache. °· If directed, apply ice to the head and neck area: °¨ Put ice in a plastic bag. °¨ Place a towel between your skin and the bag. °¨ Leave the ice on for 20 minutes, 2-3 times per day. °· Use a heating pad or hot shower to apply heat to the head and neck area as told by your health care provider. °· Keep lights dim if bright lights bother you or make your headaches worse. °Eating and Drinking °· Eat meals on a regular schedule. °· Limit alcohol use. °· Decrease the amount of caffeine you drink, or stop drinking caffeine. °General Instructions °· Keep all follow-up visits as told by your health care provider. This is important. °· Keep a headache journal to help find out what may trigger your headaches. For example, write down: °¨ What you eat and drink. °¨ How much sleep you get. °¨ Any change to your diet or medicines. °· Try massage or other relaxation techniques. °· Limit stress. °· Sit up straight, and do not tense your muscles. °· Do not use tobacco products, including cigarettes, chewing tobacco, or e-cigarettes. If you need help quitting, ask your health care provider. °· Exercise regularly as told by your health care provider. °· Sleep on a regular schedule. Get 7-9 hours of sleep, or the amount recommended by your health care provider. °SEEK MEDICAL CARE IF:  °· Your symptoms are not helped by medicine. °· You have a headache that   is different from the usual headache. °· You have nausea or you vomit. °· You have a fever. °SEEK IMMEDIATE MEDICAL CARE IF:  °· Your headache becomes severe. °· You have repeated vomiting. °· You have a stiff neck. °· You have a loss of vision. °· You have problems with speech. °· You have pain in the eye or ear. °· You have muscular weakness or loss of  muscle control. °· You lose your balance or have trouble walking. °· You feel faint or pass out. °· You have confusion. °  °This information is not intended to replace advice given to you by your health care provider. Make sure you discuss any questions you have with your health care provider. °  °Document Released: 09/29/2005 Document Revised: 06/20/2015 Document Reviewed: 01/22/2015 °Elsevier Interactive Patient Education ©2016 [employer] ° °

## 2015-10-29 NOTE — ED Provider Notes (Signed)
CSN: [id number]     Arrival date & time 10/29/15  1417 History   First MD Initiated Contact with Patient 10/29/15 1651     Chief Complaint  Patient presents with  . Headache     (Consider location/radiation/quality/duration/timing/severity/associated sxs/prior Treatment) The history is provided by the patient.    Pt with hx migraine headaches p/w occipital headache that has been intermittent for the past week and constant since yesterday.  States the headaches have come on gradually, occuring every 3-4 hours for approximately 20 minutes at a time.  Pain is worse when she stretches her neck muscles, touching her chin to her chest.  Has taken topamax, OTC advil, tylenol without benefit.  Yesterday started having tingling in the middle of her upper and lower lips that has also been intermittent. This only occurs when the pain is bad.  Historically her migraines have involved pain across her forehead.  Denies fevers, recent illness, head trauma, neck pain or stiffness, "worst" headache of life, sudden onset or "thunderclap" headache.  Denies focal neurologic deficits.    Past Medical History  Diagnosis Date  . Hypercholesteremia   . Arthritis   . GERD (gastroesophageal reflux disease)   . Migraine    Past Surgical History  Procedure Laterality Date  . Abdominal hysterectomy    . Tubal ligation    . Breast cyst excision    . Right wrist surgery     No family history on file. Social History  Substance Use Topics  . Smoking status: Never Smoker   . Smokeless tobacco: Never Used  . Alcohol Use: No   OB History    No data available     Review of Systems  All other systems reviewed and are negative.     Allergies  Estradiol; Tetracyclines & related; and Fentanyl  Home Medications   Prior to Admission medications   Medication Sig Start Date End Date Taking? Authorizing Provider  celecoxib (CELEBREX) 200 MG capsule  12/28/14   Historical Provider, MD    dextromethorphan-guaiFENesin (MUCINEX DM) 30-600 MG per 12 hr tablet Take 1 tablet by mouth 2 (two) times daily. Patient not taking: Reported on 01/31/2015 06/22/14   Vanetta Mulders, MD  ENJUVIA 0.9 MG tablet  03/14/15   Historical Provider, MD  esomeprazole (NEXIUM) 40 MG capsule TAKE ONE CAPSULE BY MOUTH TWICE DAILY 30 MINUTES BEFORE BREAKFAST AND DINNER 01/01/15   Historical Provider, MD  estradiol (ESTRACE) 2 MG tablet TAKE 1 TABLET BY MOUTH DAILY FOR MENOPAUSE** STOP THE 1 MG TABLET** 12/04/14 06/02/15  Historical Provider, MD  fluticasone (FLONASE) 50 MCG/ACT nasal spray Place 2 sprays into both nostrils daily. 11/09/14   Kristen N Ward, DO  gabapentin (NEURONTIN) 400 MG capsule TAKE 2 CAPSULES BY MOUTH FOUR TIMES DAILY AS NEEDED FOR PAIN 01/09/15   Historical Provider, MD  guaiFENesin-codeine 100-10 MG/5ML syrup Take 10 mLs by mouth every 6 (six) hours as needed for cough. 09/12/15   Geoffery Lyons, MD  montelukast (SINGULAIR) 10 MG tablet Take 10 mg by mouth at bedtime.    Historical Provider, MD  nitroGLYCERIN (NITRODUR - DOSED IN MG/24 HR) 0.2 mg/hr patch Apply 1/4th patch to affected achilles, change daily 12/04/14   Lenda Kelp, MD  oxyCODONE-acetaminophen (PERCOCET) 10-325 MG per tablet  12/14/14   Historical Provider, MD  simvastatin (ZOCOR) 40 MG tablet  12/28/14   Historical Provider, MD  topiramate (TOPAMAX) 25 MG tablet Frequency:   Dosage:0   MG  Instructions:  Note:TAKE ONE TABLET  BY MOUTH TWICE DAILY PRN 08/20/11   Historical Provider, MD   BP 130/68 mmHg  Pulse 78  Temp(Src) 98.5 F (36.9 C) (Oral)  Resp 18  Ht 5\' 5"  (1.651 m)  Wt 132.904 kg  BMI 48.76 kg/m2  SpO2 100% Physical Exam  Constitutional: She appears well-developed and well-nourished. No distress.  HENT:  Head: Normocephalic and atraumatic.  Neck: Neck supple.  Cardiovascular: Normal rate and regular rhythm.   Pulmonary/Chest: Effort normal and breath sounds normal. No respiratory distress. She has no wheezes. She  has no rales.  Musculoskeletal:       Back:  Neurological: She is alert.  CN II-XII intact, EOMs intact, no pronator drift, grip strengths equal bilaterally; strength 5/5 in all extremities, sensation intact in all extremities; finger to nose, heel to shin, rapid alternating movements normal; gait is normal.     Skin: She is not diaphoretic.  Nursing note and vitals reviewed.   ED Course  Procedures (including critical care time) Labs Review Labs Reviewed - No data to display  Imaging Review No results found. I have personally reviewed and evaluated these images and lab results as part of my medical decision-making.   EKG Interpretation None       6:09 PM Pt reports headache is improving, is ready for d/c home.    MDM   Final diagnoses:  Acute nonintractable headache, unspecified headache type    Afebrile, nontoxic patient with occipital headache without red flags.  Given IM toradol, PO flexeril with relief.  Neurologically intact. Tightness throughout musculature in neck and upper back - spouse notes pt has had increased stress.   D/C home with motrin, valium, PCP follow up.  Discussed result, findings, treatment, and follow up  with patient.  Pt given return precautions.  Pt verbalizes understanding and agrees with plan.        Trixie Dredgemily Tommie Dejoseph, PA-C 10/29/15 2126  Rolan BuccoMelanie Belfi, MD 10/30/15 0005

## 2015-10-29 NOTE — ED Notes (Signed)
PA in with pt 

## 2015-10-29 NOTE — ED Notes (Signed)
Headache for a week. Lips tingle and pain goes down her neck x 2 days. States this pain is different from her migraine headaches.

## 2015-10-29 NOTE — ED Notes (Signed)
Pt and husband given instructions. Verbalizes understanding. No questions. Taken to d/c window.

## 2015-10-29 NOTE — ED Notes (Signed)
PA in room. Pt c/o H/A onset Wed. Tingling in lips. PERL. No neuro deficits noted.

## 2016-01-16 ENCOUNTER — Encounter (HOSPITAL_BASED_OUTPATIENT_CLINIC_OR_DEPARTMENT_OTHER): Payer: Self-pay | Admitting: *Deleted

## 2016-01-16 ENCOUNTER — Emergency Department (HOSPITAL_BASED_OUTPATIENT_CLINIC_OR_DEPARTMENT_OTHER)
Admission: EM | Admit: 2016-01-16 | Discharge: 2016-01-16 | Disposition: A | Discharge status: Home / Self Care | Payer: BLUE CROSS/BLUE SHIELD | Attending: Emergency Medicine | Admitting: Emergency Medicine

## 2016-01-16 DIAGNOSIS — R05 Cough: Secondary | ICD-10-CM | POA: Diagnosis present

## 2016-01-16 DIAGNOSIS — J209 Acute bronchitis, unspecified: Secondary | ICD-10-CM | POA: Insufficient documentation

## 2016-01-16 MED ORDER — FLUCONAZOLE 200 MG PO TABS: 200.0000 mg | ORAL_TABLET | Freq: Every day | ORAL | Status: AC

## 2016-01-16 MED ORDER — ALBUTEROL SULFATE HFA 108 (90 BASE) MCG/ACT IN AERS
2.0000 | INHALATION_SPRAY | RESPIRATORY_TRACT | Status: DC
  Administered 2016-01-16: 2 via RESPIRATORY_TRACT
  Filled 2016-01-16: qty 6.7

## 2016-01-16 MED ORDER — AZITHROMYCIN 250 MG PO TABS: 250.0000 mg | ORAL_TABLET | Freq: Every day | ORAL | Status: DC

## 2016-01-16 MED FILL — FLUCONAZOLE 200 MG TABLET: 200 | 3 days supply | Qty: 3 | Fill #0

## 2016-01-16 MED FILL — AZITHROMYCIN 250 MG TABLET: 250 | 5 days supply | Qty: 6 | Fill #0

## 2016-01-16 NOTE — ED Provider Notes (Signed)
CSN: [id number]     Arrival date & time 01/16/16  1039 History   First MD Initiated Contact with Patient 01/16/16 1056     Chief Complaint  Patient presents with  . Cough      HPI Patient ports persistent cough and congestion over the past 2 weeks.  She reports mucus production.  No history of asthma or COPD.  She does not smoke cigarettes.  No chest pain or chest tightness.  Denies abdominal pain.  States the cough is worse and keeps her up at night.  Symptoms are moderate in severity   Past Medical History  Diagnosis Date  . Hypercholesteremia   . Arthritis   . GERD (gastroesophageal reflux disease)   . Migraine    Past Surgical History  Procedure Laterality Date  . Abdominal hysterectomy    . Tubal ligation    . Breast cyst excision    . Right wrist surgery     History reviewed. No pertinent family history. Social History  Substance Use Topics  . Smoking status: Never Smoker   . Smokeless tobacco: Never Used  . Alcohol Use: No   OB History    No data available     Review of Systems  All other systems reviewed and are negative.     Allergies  Estradiol; Tetracyclines & related; and Fentanyl  Home Medications   Prior to Admission medications   Medication Sig Start Date End Date Taking? Authorizing Provider  celecoxib (CELEBREX) 200 MG capsule  12/28/14   Historical Provider, MD  dextromethorphan-guaiFENesin (MUCINEX DM) 30-600 MG per 12 hr tablet Take 1 tablet by mouth 2 (two) times daily. Patient not taking: Reported on 01/31/2015 06/22/14   Vanetta Mulders, MD  diazepam (VALIUM) 5 MG tablet Take 1 tablet (5 mg total) by mouth every 8 (eight) hours as needed for muscle spasms (and pain). 10/29/15   Trixie Dredge, PA-C  ENJUVIA 0.9 MG tablet  03/14/15   Historical Provider, MD  esomeprazole (NEXIUM) 40 MG capsule TAKE ONE CAPSULE BY MOUTH TWICE DAILY 30 MINUTES BEFORE BREAKFAST AND DINNER 01/01/15   Historical Provider, MD  estradiol (ESTRACE) 2 MG tablet TAKE 1 TABLET  BY MOUTH DAILY FOR MENOPAUSE** STOP THE 1 MG TABLET** 12/04/14 06/02/15  Historical Provider, MD  fluticasone (FLONASE) 50 MCG/ACT nasal spray Place 2 sprays into both nostrils daily. 11/09/14   Kristen N Ward, DO  gabapentin (NEURONTIN) 400 MG capsule TAKE 2 CAPSULES BY MOUTH FOUR TIMES DAILY AS NEEDED FOR PAIN 01/09/15   Historical Provider, MD  guaiFENesin-codeine 100-10 MG/5ML syrup Take 10 mLs by mouth every 6 (six) hours as needed for cough. 09/12/15   Geoffery Lyons, MD  ibuprofen (ADVIL,MOTRIN) 800 MG tablet Take 1 tablet (800 mg total) by mouth every 8 (eight) hours as needed. 10/29/15   Trixie Dredge, PA-C  montelukast (SINGULAIR) 10 MG tablet Take 10 mg by mouth at bedtime.    Historical Provider, MD  nitroGLYCERIN (NITRODUR - DOSED IN MG/24 HR) 0.2 mg/hr patch Apply 1/4th patch to affected achilles, change daily 12/04/14   Lenda Kelp, MD  oxyCODONE-acetaminophen (PERCOCET) 10-325 MG per tablet  12/14/14   Historical Provider, MD  simvastatin (ZOCOR) 40 MG tablet  12/28/14   Historical Provider, MD  topiramate (TOPAMAX) 25 MG tablet Frequency:   Dosage:0   MG  Instructions:  Note:TAKE ONE TABLET BY MOUTH TWICE DAILY PRN 08/20/11   Historical Provider, MD   BP 136/76 mmHg  Pulse 59  Temp(Src) 98.2 F (36.8  C) (Oral)  Resp 18  Ht 5\' 3"  (1.6 m)  Wt 293 lb (132.904 kg)  BMI 51.92 kg/m2  SpO2 100% Physical Exam  Constitutional: She is oriented to person, place, and time. She appears well-developed and well-nourished. No distress.  HENT:  Head: Normocephalic and atraumatic.  Eyes: EOM are normal.  Neck: Normal range of motion.  Cardiovascular: Normal rate and regular rhythm.   Pulmonary/Chest: Effort normal and breath sounds normal. She has no wheezes. She has no rales.  Abdominal: Soft. She exhibits no distension. There is no tenderness.  Musculoskeletal: Normal range of motion.  Neurological: She is alert and oriented to person, place, and time.  Skin: Skin is warm and dry.  Psychiatric:  She has a normal mood and affect. Judgment normal.  Nursing note and vitals reviewed.   ED Course  Procedures (including critical care time) Labs Review Labs Reviewed - No data to display  Imaging Review No results found. I have personally reviewed and evaluated these images and lab results as part of my medical decision-making.   EKG Interpretation None      MDM   Final diagnoses:  None    Patient is overall well-appearing.  Home with albuterol and Z-Pak.  Feels better after albuterol inhaler.  Patient requests Diflucan as she tends to get yeast infections after antibiotics.  Discharge home in good condition.  Primary care follow-up.    Azalia BilisKevin Benicio Manna, MD 01/16/16 [id number]

## 2016-01-16 NOTE — ED Notes (Signed)
Pt amb to room 8 with quick steady gait in nad. Pt reports cough and congestion x 2 weeks.

## 2016-01-16 NOTE — Discharge Instructions (Signed)
Bronchospasm, Adult  A bronchospasm is a spasm or tightening of the airways going into the lungs. During a bronchospasm breathing becomes more difficult because the airways get smaller. When this happens there can be coughing, a whistling sound when breathing (wheezing), and difficulty breathing. Bronchospasm is often associated with asthma, but not all patients who experience a bronchospasm have asthma.  CAUSES   A bronchospasm is caused by inflammation or irritation of the airways. The inflammation or irritation may be triggered by:   · Allergies (such as to animals, pollen, food, or mold). Allergens that cause bronchospasm may cause wheezing immediately after exposure or many hours later.    · Infection. Viral infections are believed to be the most common cause of bronchospasm.    · Exercise.    · Irritants (such as pollution, cigarette smoke, strong odors, aerosol sprays, and paint fumes).    · Weather changes. Winds increase molds and pollens in the air. Rain refreshes the air by washing irritants out. Cold air may cause inflammation.    · Stress and emotional upset.    SIGNS AND SYMPTOMS   · Wheezing.    · Excessive nighttime coughing.    · Frequent or severe coughing with a simple cold.    · Chest tightness.    · Shortness of breath.    DIAGNOSIS   Bronchospasm is usually diagnosed through a history and physical exam. Tests, such as chest X-rays, are sometimes done to look for other conditions.  TREATMENT   · Inhaled medicines can be given to open up your airways and help you breathe. The medicines can be given using either an inhaler or a nebulizer machine.  · Corticosteroid medicines may be given for severe bronchospasm, usually when it is associated with asthma.  HOME CARE INSTRUCTIONS   · Always have a plan prepared for seeking medical care. Know when to call your health care provider and local emergency services (911 in the [place] Know where you can access local emergency care.  · Only take medicines as  directed by your health care provider.  · If you were prescribed an inhaler or nebulizer machine, ask your health care provider to explain how to use it correctly. Always use a spacer with your inhaler if you were given one.  · It is necessary to remain calm during an attack. Try to relax and breathe more slowly.   · Control your home environment in the following ways:      Change your heating and air conditioning filter at least once a month.      Limit your use of fireplaces and wood stoves.    Do not smoke and do not allow smoking in your home.      Avoid exposure to perfumes and fragrances.      Get rid of pests (such as roaches and mice) and their droppings.      Throw away plants if you see mold on them.      Keep your house clean and dust free.      Replace carpet with wood, tile, or vinyl flooring. Carpet can trap dander and dust.      Use allergy-proof pillows, mattress covers, and box spring covers.      Wash bed sheets and blankets every week in hot water and dry them in a dryer.      Use blankets that are made of polyester or cotton.      Wash hands frequently.  SEEK MEDICAL CARE IF:   · You have muscle aches.    · You have chest pain.    · The sputum changes from clear or   white to yellow, green, gray, or bloody.    · The sputum you cough up gets thicker.    · There are problems that may be related to the medicine you are given, such as a rash, itching, swelling, or trouble breathing.    SEEK IMMEDIATE MEDICAL CARE IF:   · You have worsening wheezing and coughing even after taking your prescribed medicines.    · You have increased difficulty breathing.    · You develop severe chest pain.  MAKE SURE YOU:   · Understand these instructions.  · Will watch your condition.  · Will get help right away if you are not doing well or get worse.     This information is not intended to replace advice given to you by your health care provider. Make sure you discuss any questions you have with your health care  provider.     Document Released: 10/02/2003 Document Revised: 10/20/2014 Document Reviewed: 03/21/2013  Elsevier Interactive Patient Education ©2016 [employer]

## 2016-06-12 ENCOUNTER — Emergency Department (HOSPITAL_BASED_OUTPATIENT_CLINIC_OR_DEPARTMENT_OTHER)
Admission: EM | Admit: 2016-06-12 | Discharge: 2016-06-12 | Disposition: A | Discharge status: Home / Self Care | Payer: BLUE CROSS/BLUE SHIELD | Attending: Emergency Medicine | Admitting: Emergency Medicine

## 2016-06-12 ENCOUNTER — Encounter (HOSPITAL_BASED_OUTPATIENT_CLINIC_OR_DEPARTMENT_OTHER): Payer: Self-pay | Admitting: Emergency Medicine

## 2016-06-12 DIAGNOSIS — Z79899 Other long term (current) drug therapy: Secondary | ICD-10-CM | POA: Diagnosis not present

## 2016-06-12 DIAGNOSIS — R110 Nausea: Secondary | ICD-10-CM

## 2016-06-12 DIAGNOSIS — R42 Dizziness and giddiness: Secondary | ICD-10-CM | POA: Diagnosis not present

## 2016-06-12 DIAGNOSIS — R109 Unspecified abdominal pain: Secondary | ICD-10-CM | POA: Insufficient documentation

## 2016-06-12 LAB — CBC WITH DIFFERENTIAL/PLATELET
BASOS PCT: 1 %
Basophils Absolute: 0 10*3/uL (ref 0.0–0.1)
EOS ABS: 0 10*3/uL (ref 0.0–0.7)
Eosinophils Relative: 1 %
HCT: 39.8 % (ref 36.0–46.0)
HEMOGLOBIN: 13 g/dL (ref 12.0–15.0)
Lymphocytes Relative: 43 %
Lymphs Abs: 2.7 10*3/uL (ref 0.7–4.0)
MCH: 27.2 pg (ref 26.0–34.0)
MCHC: 32.7 g/dL (ref 30.0–36.0)
MCV: 83.3 fL (ref 78.0–100.0)
MONOS PCT: 6 %
Monocytes Absolute: 0.4 10*3/uL (ref 0.1–1.0)
NEUTROS PCT: 51 %
Neutro Abs: 3.2 10*3/uL (ref 1.7–7.7)
PLATELETS: 284 10*3/uL (ref 150–400)
RBC: 4.78 MIL/uL (ref 3.87–5.11)
RDW: 13.4 % (ref 11.5–15.5)
WBC: 6.3 10*3/uL (ref 4.0–10.5)

## 2016-06-12 LAB — URINALYSIS, ROUTINE W REFLEX MICROSCOPIC
BILIRUBIN URINE: NEGATIVE
Glucose, UA: NEGATIVE mg/dL
Hgb urine dipstick: NEGATIVE
KETONES UR: NEGATIVE mg/dL
NITRITE: NEGATIVE
PH: 7 (ref 5.0–8.0)
Protein, ur: NEGATIVE mg/dL
Specific Gravity, Urine: 1.016 (ref 1.005–1.030)

## 2016-06-12 LAB — TROPONIN I: Troponin I: <0.03 ng/mL (ref <0.03)

## 2016-06-12 LAB — URINE MICROSCOPIC-ADD ON: RBC / HPF: NONE SEEN RBC/hpf (ref 0–5)

## 2016-06-12 LAB — COMPREHENSIVE METABOLIC PANEL
ALBUMIN: 4.3 g/dL (ref 3.5–5.0)
ALK PHOS: 61 U/L (ref 38–126)
ALT: 13 U/L — ABNORMAL LOW (ref 14–54)
ANION GAP: 7 (ref 5–15)
AST: 18 U/L (ref 15–41)
BUN: 12 mg/dL (ref 6–20)
CALCIUM: 9.1 mg/dL (ref 8.9–10.3)
CHLORIDE: 106 mmol/L (ref 101–111)
CO2: 24 mmol/L (ref 22–32)
Creatinine, Ser: 0.81 mg/dL (ref 0.44–1.00)
GFR calc Af Amer: >60 mL/min (ref >60)
GFR calc non Af Amer: >60 mL/min (ref >60)
GLUCOSE: 96 mg/dL (ref 65–99)
POTASSIUM: 3.6 mmol/L (ref 3.5–5.1)
SODIUM: 137 mmol/L (ref 135–145)
Total Bilirubin: 1 mg/dL (ref 0.3–1.2)
Total Protein: 7.9 g/dL (ref 6.5–8.1)

## 2016-06-12 LAB — LIPASE, BLOOD: Lipase: 19 U/L (ref 11–51)

## 2016-06-12 MED ORDER — SODIUM CHLORIDE 0.9 % IV BOLUS (SEPSIS)
1000.0000 mL | Freq: Once | INTRAVENOUS | Status: AC
  Administered 2016-06-12: 1000 mL via INTRAVENOUS

## 2016-06-12 MED ORDER — PROMETHAZINE HCL 25 MG/ML IJ SOLN
25.0000 mg | Freq: Once | INTRAMUSCULAR | Status: AC
  Administered 2016-06-12: 25 mg via INTRAVENOUS
  Filled 2016-06-12: qty 1

## 2016-06-12 MED ORDER — PROMETHAZINE HCL 25 MG/ML IJ SOLN
6.2500 mg | Freq: Once | INTRAMUSCULAR | Status: DC
  Filled 2016-06-12: qty 1

## 2016-06-12 MED ORDER — PROMETHAZINE HCL 25 MG PO TABS: 25.0000 mg | ORAL_TABLET | Freq: Four times a day (QID) | ORAL | 0 refills | Status: DC | PRN

## 2016-06-12 MED ORDER — ONDANSETRON HCL 4 MG/2ML IJ SOLN
4.0000 mg | Freq: Once | INTRAMUSCULAR | Status: AC
  Administered 2016-06-12: 4 mg via INTRAVENOUS
  Filled 2016-06-12: qty 2

## 2016-06-12 MED FILL — PROMETHAZINE 25 MG TABLET: 25 | 5 days supply | Qty: 20 | Fill #0

## 2016-06-12 NOTE — ED Notes (Signed)
Phenergan pulled from med room and wasted, when brought to room  pA changed order to 25mg . First dose wasted in trash.

## 2016-06-12 NOTE — Discharge Instructions (Signed)
Read the information below.   Your labs were re-assuring. Your nausea improved following medication and IV fluids.   Please call your stomach doctor (gastroenterologist) to schedule a follow up appointment for further evaluation.  I have prescribed phenergan for relief of nausea. Please take as directed.  Use the prescribed medication as directed.  Please discuss all new medications with your pharmacist.   Be sure to call and follow up with your primary care doctor.  You may return to the Emergency Department at any time for worsening condition or any new symptoms that concern you. Return to ED if you develop fever, localized abdominal pain, vomiting, blood, inability to keep food/fluid down, blood in stool, shortness of breath, or chest pain.

## 2016-06-12 NOTE — ED Triage Notes (Signed)
Constant nausea since Saturday, denies vomiting and fever

## 2016-06-12 NOTE — ED Notes (Signed)
PA at bedside.

## 2016-06-12 NOTE — ED Notes (Signed)
Pt given Sprite per her request 

## 2016-06-12 NOTE — ED Provider Notes (Signed)
[facility]   CSN: [id number] Arrival date & time: 06/12/16  4098     History   Chief Complaint Chief Complaint  Patient presents with  . Nausea    HPI Sharon Lamb is a 55 y.o. female.  [name] is a 55 y.o. female with history of hiatal hernia, acid reflux, arthritis, high cholesterol, obesity, migraine, osteoarthritis presents to ED with complaint of nausea. Patient states she has had constant nausea since Saturday. She denies any vomiting. She endorses one episode of a black stool on Monday and two episodes of "loose stool." She has intermittent epigastric abdominal pain described as a dull ache 9/10 after eating that resolves on its one. She also endorses lightheadedness. She denies fever, trouble swallowing, chest pain, lower leg swelling/pain, shortness of breath, dizziness, syncope, numbness/weakness, vaginal discharge, vaginal bleeding, dysuria, or hematuria. She has tried her acid reflux medicine and pepto bismol with minimal relief. No one with similar symptoms. She denies h/o PUD, GI bleeds, ulcerative colitis, or crohns. She is s/p cholecystectomy. She does have a history of a hiatal hernia. She recently had a upper endoscopy and colonoscopy in December 2016 that were re-assuring. Per review of records family h/o gastric cancer (sister, 12, living).       Past Medical History:  Diagnosis Date  . Arthritis   . GERD (gastroesophageal reflux disease)   . Hypercholesteremia   . Migraine     Patient Active Problem List   Diagnosis Date Noted  . Breast lump 01/15/2015  . Achilles tendon sprain 01/15/2015  . Breast pain 01/15/2015  . Decreased potassium in the blood 01/12/2015  . Right ankle injury 11/21/2014  . Hypertensive pulmonary vascular disease (HCC) 09/12/2014  . NASH (nonalcoholic steatohepatitis) 03/31/2014  . Calculus of kidney 03/31/2014  . Hemorrhoids, internal 02/08/2014  . DDD (degenerative disc disease), cervical  01/03/2014  . Acid reflux 01/03/2014  . Hypercholesteremia 11/30/2013  . Extreme obesity (HCC) 11/30/2013  . Cardiac enlargement 07/22/2013  . 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 Allergic rhinitis 03/19/2013  . Chronic nonalcoholic liver disease 03/19/2013  . Generalized OA 03/19/2013  . Headache, migraine 03/19/2013  . Obstructive apnea 03/19/2013  . 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 ABDOMINAL HYSTERECTOMY    . BREAST CYST EXCISION    . right wrist surgery    . TUBAL LIGATION      OB History    No data available       Home Medications    Prior to Admission medications   Medication Sig Start Date End Date Taking? Authorizing Provider  celecoxib (CELEBREX) 200 MG capsule  12/28/14  Yes Historical Provider, MD  esomeprazole (NEXIUM) 40 MG capsule TAKE ONE CAPSULE BY MOUTH TWICE DAILY 30 MINUTES BEFORE BREAKFAST AND DINNER 01/01/15  Yes Historical Provider, MD  fluticasone (FLONASE) 50 MCG/ACT nasal spray Place 2 sprays into both nostrils daily. 11/09/14  Yes Kristen N Ward, DO  gabapentin (NEURONTIN) 400 MG capsule TAKE 2 CAPSULES BY MOUTH FOUR TIMES DAILY AS NEEDED FOR PAIN 01/09/15  Yes Historical Provider, MD  montelukast (SINGULAIR) 10 MG tablet Take 10 mg by mouth at bedtime.   Yes Historical Provider, MD  oxyCODONE-acetaminophen (PERCOCET) 10-325 MG per tablet  12/14/14  Yes Historical Provider, MD  simvastatin (ZOCOR) 40 MG tablet  12/28/14  Yes Historical Provider, MD  azithromycin (ZITHROMAX Z-PAK) 250 MG tablet Take 1 tablet (250  mg total) by mouth daily. Take 2 tabs for first dose, then 1 tab for each additional dose 01/16/16   Azalia BilisKevin Campos, MD  dextromethorphan-guaiFENesin [facility](MUCINEX DM) 30-600 MG per 12 hr tablet Take 1 tablet by mouth 2 (two) times daily. Patient not taking: Reported on 01/31/2015 06/22/14   Vanetta MuldersScott Zackowski, MD  diazepam (VALIUM) 5 MG tablet Take 1 tablet (5 mg  total) by mouth every 8 (eight) hours as needed for muscle spasms (and pain). 10/29/15   Trixie DredgeEmily West, PA-C  ENJUVIA 0.9 MG tablet  03/14/15   Historical Provider, MD  estradiol (ESTRACE) 2 MG tablet TAKE 1 TABLET BY MOUTH DAILY FOR MENOPAUSE** STOP THE 1 MG TABLET** 12/04/14 06/02/15  Historical Provider, MD  guaiFENesin-codeine 100-10 MG/5ML syrup Take 10 mLs by mouth every 6 (six) hours as needed for cough. 09/12/15   Geoffery Lyonsouglas Delo, MD  ibuprofen (ADVIL,MOTRIN) 800 MG tablet Take 1 tablet (800 mg total) by mouth every 8 (eight) hours as needed. 10/29/15   Trixie DredgeEmily West, PA-C  nitroGLYCERIN (NITRODUR - DOSED IN MG/24 HR) 0.2 mg/hr patch Apply 1/4th patch to affected achilles, change daily 12/04/14   Lenda KelpShane R Hudnall, MD  promethazine (PHENERGAN) 25 MG tablet Take 1 tablet (25 mg total) by mouth every 6 (six) hours as needed for nausea or vomiting. 06/12/16   Lona KettleAshley Laurel Terren Haberle, PA-C  topiramate (TOPAMAX) 25 MG tablet Frequency:   Dosage:0   MG  Instructions:  Note:TAKE ONE TABLET BY MOUTH TWICE DAILY PRN 08/20/11   Historical Provider, MD    Family History No family history on file.  Social History Social History  Substance Use Topics  . Smoking status: Never Smoker  . Smokeless tobacco: Never Used  . Alcohol use No     Allergies   Estradiol; Tetracyclines & related; and Fentanyl   Review of Systems Review of Systems  Constitutional: Negative for chills, diaphoresis and fever.  HENT: Negative for trouble swallowing.   Eyes: Negative for visual disturbance.  Respiratory: Negative for shortness of breath.   Cardiovascular: Negative for chest pain.  Gastrointestinal: Positive for abdominal pain and nausea. Negative for blood in stool, constipation, diarrhea and vomiting.  Genitourinary: Negative for dysuria, hematuria, vaginal bleeding, vaginal discharge and vaginal pain.  Musculoskeletal: Negative for neck pain.  Skin: Negative for rash.  Neurological: Positive for light-headedness. Negative  for syncope.     Physical Exam Updated Vital Signs BP 135/79   Pulse (!) 48   Temp 98.4 F (36.9 C) (Oral)   Resp 19   Ht 5\' 6"  (1.676 m)   Wt 136.1 kg   SpO2 99%   BMI 48.42 kg/m   Physical Exam  Constitutional: She appears well-developed and well-nourished. No distress.  HENT:  Head: Normocephalic and atraumatic.  Mouth/Throat: Oropharynx is clear and moist. No oropharyngeal exudate.  Eyes: Conjunctivae and EOM are normal. Pupils are equal, round, and reactive to light. Right eye exhibits no discharge. Left eye exhibits no discharge. No scleral icterus.  Neck: Normal range of motion. Neck supple.  Cardiovascular: Regular rhythm, normal heart sounds and intact distal pulses.  Bradycardia present.   No murmur heard. Pulmonary/Chest: Effort normal and breath sounds normal. No respiratory distress.  Abdominal: Soft. Bowel sounds are normal. She exhibits no distension. There is no tenderness. There is no rigidity, no rebound and no guarding.  Musculoskeletal: Normal range of motion.  Lymphadenopathy:    She has no cervical adenopathy.  Neurological: She is alert. Coordination normal.  Skin: Skin is warm and  dry. She is not diaphoretic.  Psychiatric: She has a normal mood and affect. Her behavior is normal.     ED Treatments / Results  Labs (all labs ordered are listed, but only abnormal results are displayed) Labs Reviewed  URINALYSIS, ROUTINE W REFLEX MICROSCOPIC (NOT AT [facility]) - Abnormal; Notable for the following:       Result Value   Leukocytes, UA SMALL (*)    All other components within normal limits  COMPREHENSIVE METABOLIC PANEL - Abnormal; Notable for the following:    ALT 13 (*)    All other components within normal limits  URINE MICROSCOPIC-ADD ON - Abnormal; Notable for the following:    Squamous Epithelial / LPF 0-5 (*)    Bacteria, UA RARE (*)    All other components within normal limits  CBC WITH DIFFERENTIAL/PLATELET  LIPASE, BLOOD  TROPONIN I     EKG  EKG Interpretation  Date/Time:  Thursday June 12 2016 10:21:53 EDT Ventricular Rate:  48 PR Interval:    QRS Duration: 108 QT Interval:  523 QTC Calculation: 468 R Axis:   15 Text Interpretation:  Sinus bradycardia Abnormal R-wave progression, early transition No previous ECGs available Confirmed by YAO  MD, DAVID ([id number]) on 06/12/2016 10:24:17 AM Also confirmed by Silverio Lay  MD, DAVID ([id number]), editor [place], Jola Babinski [phone])  on 06/12/2016 11:27:57 AM       Radiology No results found.  Procedures Procedures (including critical care time)  Medications Ordered in ED Medications  sodium chloride 0.9 % bolus 1,000 mL (0 mLs Intravenous Stopped 06/12/16 1221)  ondansetron (ZOFRAN) injection 4 mg (4 mg Intravenous Given 06/12/16 1019)  promethazine (PHENERGAN) injection 25 mg (25 mg Intravenous Given 06/12/16 1228)     Initial Impression / Assessment and Plan / ED Course  I have reviewed the triage vital signs and the nursing notes.  Pertinent labs & imaging results that were available during my care of the patient were reviewed by me and considered in my medical decision making (see chart for details).  Clinical Course  Comment By Time  On re-evaluation patient endorses improvement in nausea; however, still feels slightly nauseated. Abdomen remains soft and non-tender.  Lona Kettle, [place] 08/31 1215  On re-evaluation patient endorses improvement in nausea. Passed PO challenge.  Lona Kettle, [place] 08/31 1300    Patient presents to ED with complaint of nausea. Patient is afebrile and non-toxic appearing in NAD. Vital signs remarkable for bradycardia and mildly elevated BP. Physical exam is re-assuring. +BS, abdomen soft and non-tender without guarding, rigidity, or peritoneal signs. Patient is s/p cholecystectomy low suspicion for GB etiology. Review of records indicate patient had +FOBT in late 2016, she underwent upper endoscopy and colonoscopy; results revealed  sliding hiatal hernia and internal hemorrhoids, no evidence of peptic disease, esophagitis, or colonic polyps. IVF and zofran initiated. Will check labs. Patient does not have chest pain or SOB, but given age and female gender will check troponin and EKG.   CMP re-assuring. Lipase normal - low suspicion pancreatitis. U/A negative for UTI. CBC re-assuring - no elevated WBC, no evidence of anemia. Troponin normal. EKG shows sinus bradycardia with abnormal r-wave progression. On re-evaluation patient endorses improvement in nausea. Abdominal exam remains benign. Doubt perforation or obstruction - abdomen soft, non-tender, without peritoneal signs, guarding, or rigidity. Low suspicion for acute abdomen, do not think imaging is warranted at this time. Suspect sxs may be related to ?acid reflux vs. ?hiatal hernia. Discussed results and plan with  patient. Rx phenergan. Continue acid reflux medications. Encouraged patient to call GI for appointment for further evaluation. Return precautions given. Patient voiced understanding and is agreeable.    Final Clinical Impressions(s) / ED Diagnoses   Final diagnoses:  Nausea  Abdominal pain, unspecified abdominal location    New Prescriptions Discharge Medication List as of 06/12/2016  1:07 PM    START taking these medications   Details  promethazine (PHENERGAN) 25 MG tablet Take 1 tablet (25 mg total) by mouth every 6 (six) hours as needed for nausea or vomiting., Starting Thu 06/12/2016, Print         [place], [place] 06/13/16 1610    Charlynne Pander, MD 06/16/16 [phone]

## 2016-10-13 HISTORY — PX: ROTATOR CUFF REPAIR: SHX139

## 2017-01-21 ENCOUNTER — Emergency Department (HOSPITAL_BASED_OUTPATIENT_CLINIC_OR_DEPARTMENT_OTHER): Payer: BC Managed Care – PPO

## 2017-01-21 ENCOUNTER — Emergency Department (HOSPITAL_BASED_OUTPATIENT_CLINIC_OR_DEPARTMENT_OTHER)
Admission: EM | Admit: 2017-01-21 | Discharge: 2017-01-21 | Disposition: A | Discharge status: Home / Self Care | Payer: BC Managed Care – PPO | Attending: Emergency Medicine | Admitting: Emergency Medicine

## 2017-01-21 ENCOUNTER — Encounter (HOSPITAL_BASED_OUTPATIENT_CLINIC_OR_DEPARTMENT_OTHER): Payer: Self-pay | Admitting: *Deleted

## 2017-01-21 DIAGNOSIS — R1033 Periumbilical pain: Secondary | ICD-10-CM | POA: Diagnosis present

## 2017-01-21 DIAGNOSIS — R1031 Right lower quadrant pain: Secondary | ICD-10-CM | POA: Diagnosis not present

## 2017-01-21 HISTORY — DX: Fibromyalgia: M79.7

## 2017-01-21 HISTORY — DX: Personal history of other diseases of the musculoskeletal system and connective tissue: Z87.39

## 2017-01-21 LAB — CBC
HEMATOCRIT: 37.9 % (ref 36.0–46.0)
Hemoglobin: 12.6 g/dL (ref 12.0–15.0)
MCH: 28.1 pg (ref 26.0–34.0)
MCHC: 33.2 g/dL (ref 30.0–36.0)
MCV: 84.6 fL (ref 78.0–100.0)
Platelets: 314 10*3/uL (ref 150–400)
RBC: 4.48 MIL/uL (ref 3.87–5.11)
RDW: 13.4 % (ref 11.5–15.5)
WBC: 6.3 10*3/uL (ref 4.0–10.5)

## 2017-01-21 LAB — URINALYSIS, ROUTINE W REFLEX MICROSCOPIC
BILIRUBIN URINE: NEGATIVE
Glucose, UA: NEGATIVE mg/dL
Hgb urine dipstick: NEGATIVE
KETONES UR: 15 mg/dL — AB
LEUKOCYTES UA: NEGATIVE
NITRITE: NEGATIVE
PH: 6.5 (ref 5.0–8.0)
PROTEIN: NEGATIVE mg/dL
Specific Gravity, Urine: 1.018 (ref 1.005–1.030)

## 2017-01-21 LAB — COMPREHENSIVE METABOLIC PANEL
ALBUMIN: 3.9 g/dL (ref 3.5–5.0)
ALK PHOS: 72 U/L (ref 38–126)
ALT: 20 U/L (ref 14–54)
ANION GAP: 10 (ref 5–15)
AST: 27 U/L (ref 15–41)
BUN: 14 mg/dL (ref 6–20)
CHLORIDE: 103 mmol/L (ref 101–111)
CO2: 24 mmol/L (ref 22–32)
CREATININE: 0.78 mg/dL (ref 0.44–1.00)
Calcium: 8.9 mg/dL (ref 8.9–10.3)
GFR calc non Af Amer: >60 mL/min (ref >60)
Glucose, Bld: 94 mg/dL (ref 65–99)
Potassium: 3.6 mmol/L (ref 3.5–5.1)
SODIUM: 137 mmol/L (ref 135–145)
TOTAL PROTEIN: 7.7 g/dL (ref 6.5–8.1)
Total Bilirubin: 0.9 mg/dL (ref 0.3–1.2)

## 2017-01-21 LAB — LIPASE, BLOOD: LIPASE: 16 U/L (ref 11–51)

## 2017-01-21 MED ORDER — IOPAMIDOL (ISOVUE-300) INJECTION 61%
100.0000 mL | Freq: Once | INTRAVENOUS | Status: AC | PRN
  Administered 2017-01-21: 100 mL via INTRAVENOUS

## 2017-01-21 MED ORDER — OXYCODONE-ACETAMINOPHEN 5-325 MG PO TABS
2.0000 | ORAL_TABLET | Freq: Once | ORAL | Status: AC
  Administered 2017-01-21: 2 via ORAL
  Filled 2017-01-21: qty 2

## 2017-01-21 NOTE — ED Notes (Signed)
Patient transported to CT 

## 2017-01-21 NOTE — Discharge Instructions (Signed)
As discussed, drink plenty of fluids to keep urine clear. Monitor for any worsening. Please be seen if you nausea, vomiting, diarrhea, increased abdominal pain, fever, chills or any other new concerning symptoms. Follow-up with her primary care provider. Start with a fluid diet and slowly reintroduce foods.

## 2017-01-21 NOTE — ED Triage Notes (Signed)
Pt reports periumbilical pain radiating to RLQ since this past Friday. Denies fever, n/v/d, constipation, genitourinary symptoms.

## 2017-01-21 NOTE — ED Provider Notes (Signed)
[facility] Provider Note   CSN: [id number] Arrival date & time: 01/21/17  0954     History   Chief Complaint Chief Complaint  Patient presents with  . Abdominal Pain    HPI Sharon Lamb is a 56 y.o. female presenting with a gradual onset of periumbilical pain that is sharp and intermittent that has now moved to her right lower quadrant since Sunday. She reports that the pain is worse today and has been constant which has prompted her ED visit today. She hasn't tried anything new for the pain, she reports that she is already on pain medications. She finds no relief from different positions. She reports a normal bowel movement this morning. She denies fever, chills, nausea, vomiting, diarrhea, blood in her stool, hematuria, dysuria vaginal discharge.  HPI  Past Medical History:  Diagnosis Date  . Arthritis   . Fibromyalgia   . GERD (gastroesophageal reflux disease)   . History of degenerative disc disease   . Hypercholesteremia   . Migraine     Patient Active Problem List   Diagnosis Date Noted  . Breast lump 01/15/2015  . Achilles tendon sprain 01/15/2015  . Breast pain 01/15/2015  . Decreased potassium in the blood 01/12/2015  . Right ankle injury 11/21/2014  . 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 NASH (nonalcoholic steatohepatitis) 03/31/2014  . Calculus of kidney 03/31/2014  . Hemorrhoids, internal 02/08/2014  . DDD (degenerative disc disease), cervical 01/03/2014  . Acid reflux 01/03/2014  . Hypercholesteremia 11/30/2013  . Extreme obesity (HCC) 11/30/2013  . Cardiac enlargement 07/22/2013  . 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 Allergic rhinitis 03/19/2013  . Chronic nonalcoholic liver disease 03/19/2013  . Generalized OA 03/19/2013  . Headache, migraine 03/19/2013  . Obstructive apnea 03/19/2013  . 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 ABDOMINAL HYSTERECTOMY    . ACHILLES TENDON REPAIR Right 2016  . BREAST CYST EXCISION    . right wrist surgery    . ROTATOR CUFF REPAIR Right 2018  . TUBAL LIGATION      OB History    No data available       Home Medications    Prior to Admission medications   Medication Sig Start Date End Date Taking? Authorizing Provider  celecoxib (CELEBREX) 200 MG capsule  12/28/14  Yes Historical Provider, MD  DULoxetine (CYMBALTA) 20 MG capsule Take 20 mg by mouth daily.   Yes Historical Provider, MD  escitalopram (LEXAPRO) 20 MG tablet Take 20 mg by mouth daily.   Yes Historical Provider, MD  esomeprazole (NEXIUM) 40 MG capsule TAKE ONE CAPSULE BY MOUTH TWICE DAILY 30 MINUTES BEFORE BREAKFAST AND DINNER 01/01/15  Yes Historical Provider, MD  estradiol (ESTRACE) 2 MG tablet TAKE 1 TABLET BY MOUTH DAILY FOR MENOPAUSE** STOP THE 1 MG TABLET** 12/04/14 01/21/17 Yes Historical Provider, MD  fluticasone (FLONASE) 50 MCG/ACT nasal spray Place 2 sprays into both nostrils daily. 11/09/14  Yes Kristen N Ward, DO  gabapentin (NEURONTIN) 400 MG capsule TAKE 2 CAPSULES BY MOUTH FOUR TIMES DAILY AS NEEDED FOR PAIN 01/09/15  Yes Historical Provider, MD  montelukast (SINGULAIR) 10 MG tablet Take 10 mg by mouth at bedtime.   Yes Historical Provider, MD  oxyCODONE-acetaminophen (PERCOCET) 10-325 MG per tablet  12/14/14  Yes Historical Provider, MD  simvastatin (ZOCOR) 40 MG tablet  12/28/14  Yes Historical Provider, MD  tiZANidine (ZANAFLEX) 2 MG tablet  Take 2 mg by mouth 2 (two) times daily.   Yes Historical Provider, MD  topiramate (TOPAMAX) 25 MG tablet Frequency:   Dosage:0   MG  Instructions:  Note:TAKE ONE TABLET BY MOUTH TWICE DAILY PRN 08/20/11  Yes Historical Provider, MD  traZODone (DESYREL) 100 MG tablet Take 100 mg by mouth at bedtime.   Yes Historical Provider, MD  Vitamin D, Ergocalciferol, (DRISDOL) 50000 units CAPS capsule Take 50,000 Units by mouth every 7 (seven) days.   Yes Historical Provider,  MD  azithromycin (ZITHROMAX Z-PAK) 250 MG tablet Take 1 tablet (250 mg total) by mouth daily. Take 2 tabs for first dose, then 1 tab for each additional dose 01/16/16   Azalia Bilis, MD  dextromethorphan-guaiFENesin [facility] DM) 30-600 MG per 12 hr tablet Take 1 tablet by mouth 2 (two) times daily. Patient not taking: Reported on 01/31/2015 06/22/14   Vanetta Mulders, MD  diazepam (VALIUM) 5 MG tablet Take 1 tablet (5 mg total) by mouth every 8 (eight) hours as needed for muscle spasms (and pain). 10/29/15   Trixie Dredge, PA-C  ENJUVIA 0.9 MG tablet  03/14/15   Historical Provider, MD  guaiFENesin-codeine 100-10 MG/5ML syrup Take 10 mLs by mouth every 6 (six) hours as needed for cough. 09/12/15   Geoffery Lyons, MD  ibuprofen (ADVIL,MOTRIN) 800 MG tablet Take 1 tablet (800 mg total) by mouth every 8 (eight) hours as needed. 10/29/15   Trixie Dredge, PA-C  nitroGLYCERIN (NITRODUR - DOSED IN MG/24 HR) 0.2 mg/hr patch Apply 1/4th patch to affected achilles, change daily 12/04/14   Lenda Kelp, MD  promethazine (PHENERGAN) 25 MG tablet Take 1 tablet (25 mg total) by mouth every 6 (six) hours as needed for nausea or vomiting. 06/12/16   Deborha Payment, PA-C    Family History No family history on file.  Social History Social History  Substance Use Topics  . Smoking status: Never Smoker  . Smokeless tobacco: Never Used  . Alcohol use No     Allergies   Estradiol; Tetracyclines & related; and Fentanyl   Review of Systems Review of Systems  Constitutional: Negative for chills and fever.  HENT: Negative for congestion, ear pain and sore throat.   Eyes: Negative for pain and visual disturbance.  Respiratory: Negative for cough, choking, shortness of breath, wheezing and stridor.   Cardiovascular: Negative for chest pain, palpitations and leg swelling.  Gastrointestinal: Positive for abdominal pain. Negative for abdominal distention, blood in stool, diarrhea, nausea and vomiting.       Periumbilical and  right lower quadrant  Genitourinary: Negative for difficulty urinating, dysuria, flank pain, frequency, hematuria, pelvic pain, urgency and vaginal discharge.  Musculoskeletal: Negative for arthralgias, back pain, myalgias and neck stiffness.  Skin: Negative for color change, pallor and rash.  Neurological: Negative for seizures and syncope.  All other systems reviewed and are negative.    Physical Exam Updated Vital Signs BP 105/76   Pulse (!) 53   Temp 98.2 F (36.8 C) (Oral)   Resp 20   Ht  (1.676 m)   Wt (!) 138.8 kg   SpO2 98%   BMI 49.39 kg/m   Physical Exam  Constitutional: She appears well-developed and well-nourished. No distress.  Patient is afebrile, nontoxic-appearing, sitting comfortably in bed in no acute distress.  HENT:  Head: Normocephalic and atraumatic.  Eyes: Conjunctivae and EOM are normal. Right eye exhibits no discharge. Left eye exhibits no discharge. No scleral icterus.  Neck: Normal range of motion.  Neck supple.  Cardiovascular: Normal rate, regular rhythm and normal heart sounds.   No murmur heard. Pulmonary/Chest: Effort normal and breath sounds normal. No respiratory distress. She has no wheezes. She has no rales.  Abdominal: Soft. She exhibits no distension and no mass. There is tenderness. There is no rebound and no guarding.  Negative Murphy sign. tenderness to McBurney's point, no rebound, no peritoneal signs, no mass. Right CVA tenderness  Musculoskeletal: She exhibits no edema.  Neurological: She is alert.  Skin: Skin is warm and dry. She is not diaphoretic.  Psychiatric: She has a normal mood and affect.  Nursing note and vitals reviewed.    ED Treatments / Results  Labs (all labs ordered are listed, but only abnormal results are displayed) Labs Reviewed  URINALYSIS, ROUTINE W REFLEX MICROSCOPIC - Abnormal; Notable for the following:       Result Value   Ketones, ur 15 (*)    All other components within normal limits    LIPASE, BLOOD  COMPREHENSIVE METABOLIC PANEL  CBC    EKG  EKG Interpretation None       Radiology Ct Abdomen Pelvis W Contrast  Result Date: 01/21/2017 CLINICAL DATA:  Right lower quadrant pain. EXAM: CT ABDOMEN AND PELVIS WITH CONTRAST TECHNIQUE: Multidetector CT imaging of the abdomen and pelvis was performed using the standard protocol following bolus administration of intravenous contrast. CONTRAST:  ISOVUE-300 IOPAMIDOL (ISOVUE-300) INJECTION 61% COMPARISON:  None. FINDINGS: Lower chest: No acute abnormality. Hepatobiliary: Diffuse hepatic low attenuation as can be seen with hepatic steatosis. No focal liver abnormality is seen. Status post cholecystectomy. No biliary dilatation. Pancreas: Unremarkable. No pancreatic ductal dilatation or surrounding inflammatory changes. Spleen: Normal in size without focal abnormality. Adrenals/Urinary Tract: Normal adrenal glands. Bilateral nonobstructing nephrolithiasis. No renal mass. No hydronephrosis. Normal bladder. Stomach/Bowel: No bowel wall thickening or bowel dilatation. No pneumatosis, pneumoperitoneum or portal venous gas. Moderate amount of stool throughout the colon. Normal appendix. Vascular/Lymphatic: Normal caliber abdominal aorta. No lymphadenopathy. Reproductive: Status post hysterectomy. No adnexal masses. Other: No abdominal wall hernia or abnormality. No abdominopelvic ascites. Musculoskeletal: No acute or significant osseous findings. IMPRESSION: 1. Normal appendix. 2. Bilateral nonobstructing renal calculi. 3. Hepatic steatosis. Electronically Signed   By: Elige Ko   On: 01/21/2017 13:07    Procedures Procedures (including critical care time)  Medications Ordered in ED Medications  iopamidol (ISOVUE-300) 61 % injection 100 mL (100 mLs Intravenous Contrast Given 01/21/17 1235)  oxyCODONE-acetaminophen (PERCOCET/ROXICET) 5-325 MG per tablet 2 tablet (2 tablets Oral Given 01/21/17 1341)     Initial Impression /  Assessment and Plan / ED Course  I have reviewed the triage vital signs and the nursing notes.  Pertinent labs & imaging results that were available during my care of the patient were reviewed by me and considered in my medical decision making (see chart for details).     Patient presents with right lower quadrant pain and tenderness at McBurney's point. It initially started in the periumbilical region and now moved to the right lower quadrant. Suspicious for appendicitis will order CT abdomen pelvis.  Reassuring labs and CT imaging negative for appendicitis or acute process in the abdomen.  Pain was well-controlled while in the emergency department and patient reported improvement on reassessment. We'll discharge home with symptomatic relief and close follow-up with PCP. Patient was discussed with Dr. Deretha Emory who agrees with assessment and plan. Discussed strict return precautions and advised to return to the emergency department if experiencing any new or  worsening symptoms. Instructions were understood and patient agreed with discharge plan.  Final Clinical Impressions(s) / ED Diagnoses   Final diagnoses:  Right lower quadrant abdominal pain    New Prescriptions Discharge Medication List as of 01/21/2017  2:09 PM       Georgiana Shore, PA-C 01/21/17 1430    Vanetta Mulders, MD 01/21/17 1541

## 2017-10-28 ENCOUNTER — Emergency Department (HOSPITAL_BASED_OUTPATIENT_CLINIC_OR_DEPARTMENT_OTHER): Payer: BLUE CROSS/BLUE SHIELD

## 2017-10-28 ENCOUNTER — Other Ambulatory Visit: Payer: Self-pay

## 2017-10-28 ENCOUNTER — Emergency Department (HOSPITAL_BASED_OUTPATIENT_CLINIC_OR_DEPARTMENT_OTHER)
Admission: EM | Admit: 2017-10-28 | Discharge: 2017-10-28 | Disposition: A | Discharge status: Home / Self Care | Payer: BLUE CROSS/BLUE SHIELD | Attending: Physician Assistant | Admitting: Physician Assistant

## 2017-10-28 ENCOUNTER — Encounter (HOSPITAL_BASED_OUTPATIENT_CLINIC_OR_DEPARTMENT_OTHER): Payer: Self-pay

## 2017-10-28 DIAGNOSIS — Z79899 Other long term (current) drug therapy: Secondary | ICD-10-CM | POA: Insufficient documentation

## 2017-10-28 DIAGNOSIS — R5383 Other fatigue: Secondary | ICD-10-CM

## 2017-10-28 LAB — CBC
HCT: 39 % (ref 36.0–46.0)
HEMOGLOBIN: 12.8 g/dL (ref 12.0–15.0)
MCH: 28.3 pg (ref 26.0–34.0)
MCHC: 32.8 g/dL (ref 30.0–36.0)
MCV: 86.3 fL (ref 78.0–100.0)
Platelets: 379 10*3/uL (ref 150–400)
RBC: 4.52 MIL/uL (ref 3.87–5.11)
RDW: 13.6 % (ref 11.5–15.5)
WBC: 8.8 10*3/uL (ref 4.0–10.5)

## 2017-10-28 LAB — BASIC METABOLIC PANEL
ANION GAP: 8 (ref 5–15)
BUN: 12 mg/dL (ref 6–20)
CHLORIDE: 103 mmol/L (ref 101–111)
CO2: 25 mmol/L (ref 22–32)
Calcium: 9 mg/dL (ref 8.9–10.3)
Creatinine, Ser: 0.92 mg/dL (ref 0.44–1.00)
GFR calc non Af Amer: >60 mL/min (ref >60)
Glucose, Bld: 89 mg/dL (ref 65–99)
Potassium: 3.6 mmol/L (ref 3.5–5.1)
SODIUM: 136 mmol/L (ref 135–145)

## 2017-10-28 LAB — URINALYSIS, ROUTINE W REFLEX MICROSCOPIC
Bilirubin Urine: NEGATIVE
Glucose, UA: NEGATIVE mg/dL
Hgb urine dipstick: NEGATIVE
KETONES UR: NEGATIVE mg/dL
LEUKOCYTES UA: NEGATIVE
Nitrite: NEGATIVE
PROTEIN: NEGATIVE mg/dL
Specific Gravity, Urine: 1.02 (ref 1.005–1.030)
pH: 7 (ref 5.0–8.0)

## 2017-10-28 LAB — TROPONIN I

## 2017-10-28 MED ORDER — ONDANSETRON 4 MG PO TBDP: 4.0000 mg | ORAL_TABLET | Freq: Three times a day (TID) | ORAL | 0 refills | Status: DC | PRN

## 2017-10-28 MED ORDER — SODIUM CHLORIDE 0.9 % IV BOLUS (SEPSIS)
500.0000 mL | Freq: Once | INTRAVENOUS | Status: AC
  Administered 2017-10-28: 500 mL via INTRAVENOUS

## 2017-10-28 NOTE — ED Provider Notes (Signed)
[facility] EMERGENCY DEPARTMENT Provider Note   CSN: [id number] Arrival date & time: 10/28/17  1319     History   Chief Complaint Chief Complaint  Patient presents with  . Chest Pain    HPI Sharon Lamb is a 57 y.o. female.  HPI   Patient is a 57 year old female presenting with multiple symptoms.  Patient has had for the last 6 weeks she has not been feeling well.  She reports muscle aches, occasional cough, occasional congestion, occasional sweats, fatigue.  Patient has history of arthritis fibromyalgia migraine.  She is been to the physician 3 times in the last couple weeks.  Not everything is checked up normally.  She was there yesterday.  She says she still feels fatigued.  Patient has mild nausea.  She reports she is overdue for her next endoscopy.  Patient is up-to-date on mammography and colonoscopy.  No actual vomiting.  Past Medical History:  Diagnosis Date  . Arthritis   . Fibromyalgia   . GERD (gastroesophageal reflux disease)   . History of degenerative disc disease   . Hypercholesteremia   . Migraine     Patient Active Problem List   Diagnosis Date Noted  . Breast lump 01/15/2015  . Achilles tendon sprain 01/15/2015  . Breast pain 01/15/2015  . Decreased potassium in the blood 01/12/2015  . Right ankle injury 11/21/2014  . Hypertensive pulmonary vascular disease (HCC) 09/12/2014  . NASH (nonalcoholic steatohepatitis) 03/31/2014  . Calculus of kidney 03/31/2014  . Hemorrhoids, internal 02/08/2014  . DDD (degenerative disc disease), cervical 01/03/2014  . Acid reflux 01/03/2014  . Hypercholesteremia 11/30/2013  . Extreme obesity 11/30/2013  . Cardiac enlargement 07/22/2013  . 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 Allergic rhinitis 03/19/2013  . Chronic nonalcoholic liver disease 03/19/2013  . Generalized OA 03/19/2013  . Headache, migraine 03/19/2013  . Obstructive apnea 03/19/2013  .  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 ABDOMINAL HYSTERECTOMY    . ACHILLES TENDON REPAIR Right 2016  . BREAST CYST EXCISION    . right wrist surgery    . ROTATOR CUFF REPAIR Right 2018  . TUBAL LIGATION      OB History    No data available       Home Medications    Prior to Admission medications   Medication Sig Start Date End Date Taking? Authorizing Provider  celecoxib (CELEBREX) 200 MG capsule  12/28/14   [provider]  DULoxetine (CYMBALTA) 20 MG capsule Take 20 mg by mouth daily.    [provider]  esomeprazole (NEXIUM) 40 MG capsule TAKE ONE CAPSULE BY MOUTH TWICE DAILY 30 MINUTES BEFORE BREAKFAST AND DINNER 01/01/15   [provider]  estradiol (ESTRACE) 2 MG tablet TAKE 1 TABLET BY MOUTH DAILY FOR MENOPAUSE** STOP THE 1 MG TABLET** 12/04/14 01/21/17  [provider]  fluticasone (FLONASE) 50 MCG/ACT nasal spray Place 2 sprays into both nostrils daily. 11/09/14   Ward, Layla Maw, DO  ibuprofen (ADVIL,MOTRIN) 800 MG tablet Take 1 tablet (800 mg total) by mouth every 8 (eight) hours as needed. 10/29/15   Trixie Dredge, PA-C  montelukast (SINGULAIR) 10 MG tablet Take 10 mg by mouth at bedtime.    [provider]  oxyCODONE-acetaminophen (PERCOCET) 10-325 MG per tablet  12/14/14   [provider]  simvastatin (ZOCOR) 40 MG tablet  12/28/14   [provider]  tiZANidine (ZANAFLEX) 2  MG tablet Take 2 mg by mouth 2 (two) times daily.    [provider]  traZODone (DESYREL) 100 MG tablet Take 100 mg by mouth at bedtime.    [provider]  Vitamin D, Ergocalciferol, (DRISDOL) 50000 units CAPS capsule Take 50,000 Units by mouth every 7 (seven) days.    [provider]    Family History No family history on file.  Social History Social History   Tobacco Use  . Smoking status: Never Smoker  . Smokeless tobacco: Never Used  Substance Use Topics    . Alcohol use: No  . Drug use: No     Allergies   Tetracyclines & related and Fentanyl   Review of Systems Review of Systems  Constitutional: Positive for activity change and fatigue. Negative for fever.  HENT: Positive for congestion.   Respiratory: Positive for cough.   Gastrointestinal: Positive for nausea.  Musculoskeletal: Positive for arthralgias and myalgias.  Neurological: Positive for dizziness.     Physical Exam Updated Vital Signs BP (!) 147/81 (BP Location: Left Arm)   Pulse 76   Temp 98.5 F (36.9 C) (Oral)   Resp 16   Ht 5\' 6"  (1.676 m)   Wt 134.9 kg (297 lb 6.4 oz)   SpO2 100%   BMI 48.00 kg/m   Physical Exam  Constitutional: She is oriented to person, place, and time. She appears well-developed and well-nourished.  HENT:  Head: Normocephalic and atraumatic.  Eyes: Right eye exhibits no discharge.  Cardiovascular: Normal rate, regular rhythm and normal heart sounds.  No murmur heard. Pulmonary/Chest: Effort normal and breath sounds normal. She has no wheezes. She has no rales.  Abdominal: Soft. She exhibits no distension. There is no tenderness.  Neurological: She is oriented to person, place, and time.  Skin: Skin is warm and dry. She is not diaphoretic.  Psychiatric: She has a normal mood and affect.  Nursing note and vitals reviewed.    ED Treatments / Results  Labs (all labs ordered are listed, but only abnormal results are displayed) Labs Reviewed  BASIC METABOLIC PANEL  CBC  TROPONIN I  URINALYSIS, ROUTINE W REFLEX MICROSCOPIC    EKG  EKG Interpretation  Date/Time:  Wednesday October 28 2017 13:27:50 EST Ventricular Rate:  80 PR Interval:  154 QRS Duration: 82 QT Interval:  428 QTC Calculation: 493 R Axis:   40 Text Interpretation:  Normal sinus rhythm Prolonged QT Abnormal ECG Normal sinus rhythm Confirmed by Bary CastillaMackuen, Shelly Shoultz ([id number]) on 10/28/2017 2:34:31 PM       Radiology Dg Chest 2 View  Result Date:  10/28/2017 CLINICAL DATA:  57 year old female with 1 week of chest pain and dizziness. Recently treated with antibiotics for URI. EXAM: CHEST  2 VIEW COMPARISON:  Chest radiographs 09/25/2016, chest CT 09/26/2016, and earlier. FINDINGS: Lower lung volumes. Stable mild cardiomegaly. Other mediastinal contours are within normal limits. Visualized tracheal air column is within normal limits. No pneumothorax, pulmonary edema, pleural effusion or confluent pulmonary opacity. No acute osseous abnormality identified. Negative visible bowel gas pattern. IMPRESSION: Lower lung volumes.  No acute cardiopulmonary abnormality. Electronically Signed   By: Odessa FlemingH  Hall M.D.   On: 10/28/2017 14:17    Procedures Procedures (including critical care time)  Medications Ordered in ED Medications  sodium chloride 0.9 % bolus 500 mL (not administered)     Initial Impression / Assessment and Plan / ED Course  I have reviewed the triage vital signs and the nursing notes.  Pertinent labs &  imaging results that were available during my care of the patient were reviewed by me and considered in my medical decision making (see chart for details).    Patient is a 57 year old female presenting with multiple symptoms.  Patient has had for the last 6 weeks she has not been feeling well.  She reports muscle aches, occasional cough, occasional congestion, occasional sweats, fatigue.  Patient has history of arthritis fibromyalgia migraine.  She is been to the physician 3 times in the last couple weeks.  Not everything is checked up normally.  She was there yesterday.  She says she still feels fatigued.  Patient has mild nausea.  She reports she is overdue for her next endoscopy.  Patient is up-to-date on mammography and colonoscopy.  No actual vomiting.  2:34 PM Patient's symptoms are entirely vague, nonfocal.  Will do baseline labs make sure that patient has a normal x-ray and urine.  Patient has normal vital signs.  Physical exam  is reassuring.  Ultimately we will have her follow-up with primary for cancer screening.   Final Clinical Impressions(s) / ED Diagnoses   Final diagnoses:  None    ED Discharge Orders    None       Marcy Sookdeo, Cindee Salt, MD 10/28/17 1535

## 2017-10-28 NOTE — ED Triage Notes (Signed)
C/o PC x 1 week-NAD-steady gait

## 2017-10-28 NOTE — ED Notes (Signed)
Delay in xray, RN drawing labs.  

## 2017-10-28 NOTE — Discharge Instructions (Signed)
We are unsure what is causing her constellation of symptoms.  Lukily your labs, chest x-ray and urine were all normal.  In addition to your physical exam and your vitals.  Please follow-up with your primary care physician for further care.

## 2018-01-30 ENCOUNTER — Emergency Department (HOSPITAL_BASED_OUTPATIENT_CLINIC_OR_DEPARTMENT_OTHER): Payer: BLUE CROSS/BLUE SHIELD

## 2018-01-30 ENCOUNTER — Other Ambulatory Visit: Payer: Self-pay

## 2018-01-30 ENCOUNTER — Encounter (HOSPITAL_BASED_OUTPATIENT_CLINIC_OR_DEPARTMENT_OTHER): Payer: Self-pay | Admitting: *Deleted

## 2018-01-30 ENCOUNTER — Emergency Department (HOSPITAL_BASED_OUTPATIENT_CLINIC_OR_DEPARTMENT_OTHER)
Admission: EM | Admit: 2018-01-30 | Discharge: 2018-01-30 | Disposition: A | Discharge status: Home / Self Care | Payer: BLUE CROSS/BLUE SHIELD | Attending: Emergency Medicine | Admitting: Emergency Medicine

## 2018-01-30 DIAGNOSIS — E876 Hypokalemia: Secondary | ICD-10-CM

## 2018-01-30 DIAGNOSIS — R072 Precordial pain: Secondary | ICD-10-CM | POA: Insufficient documentation

## 2018-01-30 DIAGNOSIS — Z79899 Other long term (current) drug therapy: Secondary | ICD-10-CM | POA: Diagnosis not present

## 2018-01-30 DIAGNOSIS — R0789 Other chest pain: Secondary | ICD-10-CM

## 2018-01-30 LAB — CBC
HCT: 37.9 % (ref 36.0–46.0)
Hemoglobin: 12.6 g/dL (ref 12.0–15.0)
MCH: 28.5 pg (ref 26.0–34.0)
MCHC: 33.2 g/dL (ref 30.0–36.0)
MCV: 85.7 fL (ref 78.0–100.0)
Platelets: 351 K/uL (ref 150–400)
RBC: 4.42 MIL/uL (ref 3.87–5.11)
RDW: 13 % (ref 11.5–15.5)
WBC: 8.6 K/uL (ref 4.0–10.5)

## 2018-01-30 LAB — COMPREHENSIVE METABOLIC PANEL WITH GFR
ALT: 14 U/L (ref 14–54)
AST: 22 U/L (ref 15–41)
Albumin: 4 g/dL (ref 3.5–5.0)
Alkaline Phosphatase: 78 U/L (ref 38–126)
Anion gap: 9 (ref 5–15)
BUN: 11 mg/dL (ref 6–20)
CO2: 23 mmol/L (ref 22–32)
Calcium: 8.7 mg/dL — ABNORMAL LOW (ref 8.9–10.3)
Chloride: 106 mmol/L (ref 101–111)
Creatinine, Ser: 0.77 mg/dL (ref 0.44–1.00)
GFR calc Af Amer: >60 mL/min
GFR calc non Af Amer: >60 mL/min
Glucose, Bld: 109 mg/dL — ABNORMAL HIGH (ref 65–99)
Potassium: 3.1 mmol/L — ABNORMAL LOW (ref 3.5–5.1)
Sodium: 138 mmol/L (ref 135–145)
Total Bilirubin: 0.5 mg/dL (ref 0.3–1.2)
Total Protein: 7.5 g/dL (ref 6.5–8.1)

## 2018-01-30 LAB — TROPONIN I: Troponin I: <0.03 ng/mL

## 2018-01-30 MED ORDER — POTASSIUM CHLORIDE CRYS ER 20 MEQ PO TBCR
40.0000 meq | EXTENDED_RELEASE_TABLET | Freq: Once | ORAL | Status: AC
Start: 2018-01-30 — End: 2018-01-30
  Administered 2018-01-30: 40 meq via ORAL
  Filled 2018-01-30: qty 2

## 2018-01-30 MED ORDER — POTASSIUM CHLORIDE CRYS ER 20 MEQ PO TBCR: 20.0000 meq | EXTENDED_RELEASE_TABLET | Freq: Every day | ORAL | 0 refills | Status: AC

## 2018-01-30 MED ORDER — IBUPROFEN 400 MG PO TABS
400.0000 mg | ORAL_TABLET | Freq: Once | ORAL | Status: AC
  Administered 2018-01-30: 400 mg via ORAL
  Filled 2018-01-30: qty 1

## 2018-01-30 MED ORDER — ACETAMINOPHEN 500 MG PO TABS
1000.0000 mg | ORAL_TABLET | Freq: Once | ORAL | Status: AC
  Administered 2018-01-30: 1000 mg via ORAL
  Filled 2018-01-30: qty 2

## 2018-01-30 NOTE — ED Notes (Signed)
ED Provider at bedside. 

## 2018-01-30 NOTE — ED Provider Notes (Signed)
[facility] EMERGENCY DEPARTMENT Provider Note   CSN: [id number] Arrival date & time: 01/30/18  2018     History   Chief Complaint Chief Complaint  Patient presents with  . Chest Pain    HPI Sharon Lamb is a 57 y.o. female.  Patient c/o mid chest pain for the past 2 weeks. Symptoms mild, persistent, constant, occurs at rest. No relation to activity or exertion. Denies sob, nv or diaphoresis. Not pleuritic. Denies cough or uri symptoms. No fever or chills. No chest wall injury or strain. Denies hx cad or fam hx premature cad. Denies leg pain or swelling. No recent surgery or immobility. No hx dvt or pe. Notes hx hiatal hernia, denies current/recent heartburn. Denies abd pain.   The history is provided by the patient.  Chest Pain   Pertinent negatives include no abdominal pain, no back pain, no cough, no fever, no headaches, no nausea, no shortness of breath and no vomiting.    Past Medical History:  Diagnosis Date  . Arthritis   . Fibromyalgia   . GERD (gastroesophageal reflux disease)   . History of degenerative disc disease   . Hypercholesteremia   . Migraine     Patient Active Problem List   Diagnosis Date Noted  . Breast lump 01/15/2015  . Achilles tendon sprain 01/15/2015  . Breast pain 01/15/2015  . Decreased potassium in the blood 01/12/2015  . Right ankle injury 11/21/2014  . Hypertensive pulmonary vascular disease (HCC) 09/12/2014  . NASH (nonalcoholic steatohepatitis) 03/31/2014  . Calculus of kidney 03/31/2014  . Hemorrhoids, internal 02/08/2014  . DDD (degenerative disc disease), cervical 01/03/2014  . Acid reflux 01/03/2014  . Hypercholesteremia 11/30/2013  . Extreme obesity 11/30/2013  . Cardiac enlargement 07/22/2013  . 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 Allergic rhinitis 03/19/2013  . Chronic nonalcoholic liver disease 03/19/2013  . Generalized OA 03/19/2013  . Headache, migraine  03/19/2013  . Obstructive apnea 03/19/2013  . 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 ABDOMINAL HYSTERECTOMY    . ACHILLES TENDON REPAIR Right 2016  . BREAST CYST EXCISION    . right wrist surgery    . ROTATOR CUFF REPAIR Right 2018  . TUBAL LIGATION       OB History   None      Home Medications    Prior to Admission medications   Medication Sig Start Date End Date Taking? Authorizing Provider  celecoxib (CELEBREX) 200 MG capsule  12/28/14   [provider]  DULoxetine (CYMBALTA) 20 MG capsule Take 20 mg by mouth daily.    [provider]  esomeprazole (NEXIUM) 40 MG capsule TAKE ONE CAPSULE BY MOUTH TWICE DAILY 30 MINUTES BEFORE BREAKFAST AND DINNER 01/01/15   [provider]  estradiol (ESTRACE) 2 MG tablet TAKE 1 TABLET BY MOUTH DAILY FOR MENOPAUSE** STOP THE 1 MG TABLET** 12/04/14 01/21/17  [provider]  fluticasone (FLONASE) 50 MCG/ACT nasal spray Place 2 sprays into both nostrils daily. 11/09/14   Ward, Layla Maw, DO  ibuprofen (ADVIL,MOTRIN) 800 MG tablet Take 1 tablet (800 mg total) by mouth every 8 (eight) hours as needed. 10/29/15   Trixie Dredge, PA-C  montelukast (SINGULAIR) 10 MG tablet Take 10 mg by mouth at bedtime.    [provider]  ondansetron (ZOFRAN ODT) 4 MG disintegrating tablet Take 1 tablet (4 mg total) by mouth every 8 (eight) hours as needed for  nausea or vomiting. 10/28/17   Mackuen, Cindee Saltourteney Lyn, MD  oxyCODONE-acetaminophen (PERCOCET) 10-325 MG per tablet  12/14/14   [provider]  simvastatin (ZOCOR) 40 MG tablet  12/28/14   [provider]  tiZANidine (ZANAFLEX) 2 MG tablet Take 2 mg by mouth 2 (two) times daily.    [provider]  traZODone (DESYREL) 100 MG tablet Take 100 mg by mouth at bedtime.    [provider]  Vitamin D, Ergocalciferol, (DRISDOL) 50000 units CAPS capsule Take 50,000 Units by mouth every 7 (seven) days.     [provider]    Family History No family history on file.  Social History Social History   Tobacco Use  . Smoking status: Never Smoker  . Smokeless tobacco: Never Used  Substance Use Topics  . Alcohol use: No  . Drug use: No     Allergies   Tetracyclines & related and Fentanyl   Review of Systems Review of Systems  Constitutional: Negative for fever.  HENT: Negative for sore throat.   Eyes: Negative for redness.  Respiratory: Negative for cough and shortness of breath.   Cardiovascular: Positive for chest pain.  Gastrointestinal: Negative for abdominal pain, nausea and vomiting.  Genitourinary: Negative for flank pain.  Musculoskeletal: Negative for back pain and neck pain.  Skin: Negative for rash.  Neurological: Negative for headaches.  Hematological: Does not bruise/bleed easily.  Psychiatric/Behavioral: Negative for confusion.     Physical Exam Updated Vital Signs BP (!) 155/93 (BP Location: Left Arm)   Pulse (!) 56 Comment: Patient stated the she normally has a lower HR  Temp 98.3 F (36.8 C) (Oral)   Resp 20   Ht 1.676 m (5\' 6" )   Wt (!) 136.5 kg (301 lb)   SpO2 99%   BMI 48.58 kg/m   Physical Exam  Constitutional: She appears well-developed and well-nourished. No distress.  HENT:  Mouth/Throat: Oropharynx is clear and moist.  Eyes: Conjunctivae are normal. No scleral icterus.  Neck: Neck supple. No tracheal deviation present.  Cardiovascular: Normal rate, regular rhythm, normal heart sounds and intact distal pulses. Exam reveals no gallop and no friction rub.  No murmur heard. Pulmonary/Chest: Effort normal and breath sounds normal. No respiratory distress. She exhibits tenderness.  Abdominal: Soft. Normal appearance and bowel sounds are normal. She exhibits no distension. There is no tenderness.  Musculoskeletal: She exhibits no edema or tenderness.  Neurological: She is alert.  Skin: Skin is warm and dry. No rash noted. She is not  diaphoretic.  Psychiatric: She has a normal mood and affect.  Nursing note and vitals reviewed.    ED Treatments / Results  Labs (all labs ordered are listed, but only abnormal results are displayed) Results for orders placed or performed during the hospital encounter of 01/30/18  CBC  Result Value Ref Range   WBC 8.6 4.0 - 10.5 K/uL   RBC 4.42 3.87 - 5.11 MIL/uL   Hemoglobin 12.6 12.0 - 15.0 g/dL   HCT 40.937.9 81.136.0 - 91.446.0 %   MCV 85.7 78.0 - 100.0 fL   MCH 28.5 26.0 - 34.0 pg   MCHC 33.2 30.0 - 36.0 g/dL   RDW 78.213.0 95.611.5 - 21.315.5 %   Platelets 351 150 - 400 K/uL  Comprehensive metabolic panel  Result Value Ref Range   Sodium 138 135 - 145 mmol/L   Potassium 3.1 (L) 3.5 - 5.1 mmol/L   Chloride 106 101 - 111 mmol/L   CO2 23 22 - 32  mmol/L   Glucose, Bld 109 (H) 65 - 99 mg/dL   BUN 11 6 - 20 mg/dL   Creatinine, Ser 0.98 0.44 - 1.00 mg/dL   Calcium 8.7 (L) 8.9 - 10.3 mg/dL   Total Protein 7.5 6.5 - 8.1 g/dL   Albumin 4.0 3.5 - 5.0 g/dL   AST 22 15 - 41 U/L   ALT 14 14 - 54 U/L   Alkaline Phosphatase 78 38 - 126 U/L   Total Bilirubin 0.5 0.3 - 1.2 mg/dL   GFR calc non Af Amer >60 >60 mL/min   GFR calc Af Amer >60 >60 mL/min   Anion gap 9 5 - 15  Troponin I  Result Value Ref Range   Troponin I <0.03 <0.03 ng/mL   Dg Chest 2 View  Result Date: 01/30/2018 CLINICAL DATA:  Chest pain EXAM: CHEST - 2 VIEW COMPARISON:  10/28/2017 FINDINGS: Minimal atelectasis at the left base. No acute consolidation or effusion. Stable cardiomediastinal silhouette. No pneumothorax. IMPRESSION: No active cardiopulmonary disease. Minimal atelectasis at the left base. Electronically Signed   By: Jasmine Pang M.D.   On: 01/30/2018 21:13    EKG EKG Interpretation  Date/Time:  Saturday January 30 2018 20:25:41 EDT Ventricular Rate:  57 PR Interval:  192 QRS Duration: 96 QT Interval:  466 QTC Calculation: 453 R Axis:   -10 Text Interpretation:  Sinus bradycardia Incomplete right bundle branch block  Nonspecific T wave abnormality Confirmed by Cathren Laine ([id number]) on 01/30/2018 8:27:41 PM   Radiology Dg Chest 2 View  Result Date: 01/30/2018 CLINICAL DATA:  Chest pain EXAM: CHEST - 2 VIEW COMPARISON:  10/28/2017 FINDINGS: Minimal atelectasis at the left base. No acute consolidation or effusion. Stable cardiomediastinal silhouette. No pneumothorax. IMPRESSION: No active cardiopulmonary disease. Minimal atelectasis at the left base. Electronically Signed   By: Jasmine Pang M.D.   On: 01/30/2018 21:13    Procedures Procedures (including critical care time)  Medications Ordered in ED Medications  acetaminophen (TYLENOL) tablet 1,000 mg (has no administration in time range)  ibuprofen (ADVIL,MOTRIN) tablet 400 mg (has no administration in time range)     Initial Impression / Assessment and Plan / ED Course  I have reviewed the triage vital signs and the nursing notes.  Pertinent labs & imaging results that were available during my care of the patient were reviewed by me and considered in my medical decision making (see chart for details).  Ecg, cxr, labs.  Reviewed nursing notes and prior charts for additional history.   +chest wall tenderness on exam.   Acetaminophen po for symptom relief.  From prior records, it appears 2 years ago, unremarkable stress test.   After symptoms for prolonged period, trop is normal.   cxr neg acute.  Labs reviewed - k is low. kcl 40 meq po.  Recheck pt comfortable, no distress, no increased wob.   Pt currently appears stable for d/c.     Final Clinical Impressions(s) / ED Diagnoses   Final diagnoses:  None    ED Discharge Orders    None       Cathren Laine, MD 01/30/18 2206

## 2018-01-30 NOTE — Discharge Instructions (Addendum)
It was our pleasure to provide your ER care today - we hope that you feel better.  Take acetaminophen and/or ibuprofen as need for symptom relief.   From today's labs, your potassium level is mildly low (3.1) - eat plenty of fruits and vegetables, take potassium supplement as prescribed, and follow up with primary care doctor in 1-2 weeks.  If gi symptoms, you may try pepcid and/or maalox as need for symptom relief.  Follow up with your doctor/cardiologist in the coming week - call office Monday to arrange follow up appointment.   Return to ER if worse, new symptoms, fevers, increased trouble breathing, persistent/recurrent chest pain, other concern.

## 2018-01-30 NOTE — ED Triage Notes (Signed)
Pt reports mid chest pain x 2 weeks. Pain in bilateral ribs also

## 2019-06-24 ENCOUNTER — Emergency Department (HOSPITAL_BASED_OUTPATIENT_CLINIC_OR_DEPARTMENT_OTHER)
Admission: EM | Admit: 2019-06-24 | Discharge: 2019-06-24 | Disposition: A | Discharge status: Home / Self Care | Payer: BC Managed Care – PPO | Attending: Emergency Medicine | Admitting: Emergency Medicine

## 2019-06-24 ENCOUNTER — Encounter (HOSPITAL_BASED_OUTPATIENT_CLINIC_OR_DEPARTMENT_OTHER): Payer: Self-pay

## 2019-06-24 ENCOUNTER — Emergency Department (HOSPITAL_BASED_OUTPATIENT_CLINIC_OR_DEPARTMENT_OTHER): Payer: BC Managed Care – PPO

## 2019-06-24 ENCOUNTER — Other Ambulatory Visit: Payer: Self-pay

## 2019-06-24 DIAGNOSIS — Z79899 Other long term (current) drug therapy: Secondary | ICD-10-CM | POA: Diagnosis not present

## 2019-06-24 DIAGNOSIS — E785 Hyperlipidemia, unspecified: Secondary | ICD-10-CM | POA: Insufficient documentation

## 2019-06-24 DIAGNOSIS — R109 Unspecified abdominal pain: Secondary | ICD-10-CM | POA: Diagnosis present

## 2019-06-24 DIAGNOSIS — N201 Calculus of ureter: Secondary | ICD-10-CM | POA: Diagnosis not present

## 2019-06-24 LAB — CBC WITH DIFFERENTIAL/PLATELET
Abs Immature Granulocytes: 0.01 10*3/uL (ref 0.00–0.07)
Basophils Absolute: 0 10*3/uL (ref 0.0–0.1)
Basophils Relative: 1 %
Eosinophils Absolute: 0.1 10*3/uL (ref 0.0–0.5)
Eosinophils Relative: 1 %
HCT: 35.7 % — ABNORMAL LOW (ref 36.0–46.0)
Hemoglobin: 11.2 g/dL — ABNORMAL LOW (ref 12.0–15.0)
Immature Granulocytes: 0 %
Lymphocytes Relative: 54 %
Lymphs Abs: 3.6 10*3/uL (ref 0.7–4.0)
MCH: 27.6 pg (ref 26.0–34.0)
MCHC: 31.4 g/dL (ref 30.0–36.0)
MCV: 87.9 fL (ref 80.0–100.0)
Monocytes Absolute: 0.4 10*3/uL (ref 0.1–1.0)
Monocytes Relative: 6 %
Neutro Abs: 2.5 10*3/uL (ref 1.7–7.7)
Neutrophils Relative %: 38 %
Platelets: 267 10*3/uL (ref 150–400)
RBC: 4.06 MIL/uL (ref 3.87–5.11)
RDW: 13.4 % (ref 11.5–15.5)
WBC: 6.5 10*3/uL (ref 4.0–10.5)
nRBC: 0 % (ref 0.0–0.2)

## 2019-06-24 LAB — URINALYSIS, ROUTINE W REFLEX MICROSCOPIC
Bilirubin Urine: NEGATIVE
Glucose, UA: NEGATIVE mg/dL
Ketones, ur: NEGATIVE mg/dL
Leukocytes,Ua: NEGATIVE
Nitrite: NEGATIVE
Protein, ur: NEGATIVE mg/dL
Specific Gravity, Urine: 1.015 (ref 1.005–1.030)
pH: 7.5 (ref 5.0–8.0)

## 2019-06-24 LAB — COMPREHENSIVE METABOLIC PANEL
ALT: 8 U/L (ref 0–44)
AST: 16 U/L (ref 15–41)
Albumin: 4 g/dL (ref 3.5–5.0)
Alkaline Phosphatase: 56 U/L (ref 38–126)
Anion gap: 10 (ref 5–15)
BUN: 11 mg/dL (ref 6–20)
CO2: 23 mmol/L (ref 22–32)
Calcium: 9.2 mg/dL (ref 8.9–10.3)
Chloride: 106 mmol/L (ref 98–111)
Creatinine, Ser: 0.94 mg/dL (ref 0.44–1.00)
GFR calc Af Amer: >60 mL/min (ref >60)
GFR calc non Af Amer: >60 mL/min (ref >60)
Glucose, Bld: 95 mg/dL (ref 70–99)
Potassium: 3.1 mmol/L — ABNORMAL LOW (ref 3.5–5.1)
Sodium: 139 mmol/L (ref 135–145)
Total Bilirubin: 1.3 mg/dL — ABNORMAL HIGH (ref 0.3–1.2)
Total Protein: 7.2 g/dL (ref 6.5–8.1)

## 2019-06-24 LAB — URINALYSIS, MICROSCOPIC (REFLEX)

## 2019-06-24 LAB — LIPASE, BLOOD: Lipase: 23 U/L (ref 11–51)

## 2019-06-24 MED ORDER — OXYCODONE-ACETAMINOPHEN 5-325 MG PO TABS: 1.0000 | ORAL_TABLET | Freq: Four times a day (QID) | ORAL | 0 refills | Status: AC | PRN

## 2019-06-24 MED ORDER — POTASSIUM CHLORIDE CRYS ER 20 MEQ PO TBCR
40.0000 meq | EXTENDED_RELEASE_TABLET | Freq: Once | ORAL | Status: AC
  Administered 2019-06-24: 16:00:00 40 meq via ORAL
  Filled 2019-06-24: qty 2

## 2019-06-24 MED ORDER — ONDANSETRON HCL 4 MG/2ML IJ SOLN
4.0000 mg | Freq: Once | INTRAMUSCULAR | Status: AC
  Administered 2019-06-24: 15:00:00 4 mg via INTRAVENOUS
  Filled 2019-06-24: qty 2

## 2019-06-24 MED ORDER — TAMSULOSIN HCL 0.4 MG PO CAPS: 0.4000 mg | ORAL_CAPSULE | Freq: Every day | ORAL | 0 refills | Status: AC

## 2019-06-24 MED ORDER — POTASSIUM CHLORIDE CRYS ER 20 MEQ PO TBCR: 40.0000 meq | EXTENDED_RELEASE_TABLET | Freq: Once | ORAL | Status: DC

## 2019-06-24 MED ORDER — HYDROMORPHONE HCL 1 MG/ML IJ SOLN
1.0000 mg | Freq: Once | INTRAMUSCULAR | Status: AC
  Administered 2019-06-24: 1 mg via INTRAVENOUS
  Filled 2019-06-24: qty 1

## 2019-06-24 MED ORDER — SODIUM CHLORIDE 0.9 % IV BOLUS
1000.0000 mL | Freq: Once | INTRAVENOUS | Status: AC
  Administered 2019-06-24: 1000 mL via INTRAVENOUS

## 2019-06-24 MED ORDER — ONDANSETRON 4 MG PO TBDP: ORAL_TABLET | ORAL | 0 refills | Status: DC

## 2019-06-24 NOTE — Discharge Instructions (Addendum)
You have a left-sided kidney stone that is causing your pain.  Please take Flomax daily, use Zofran for nausea and Percocet for pain.  You can use ibuprofen in addition to this for pain.  Please follow-up with the urologist, you have multiple kidney stones noted in both kidneys.  Your CT scan shows some mild dilation of your common bile duct as well as a mass in the right lobe of your liver, your liver function studies on your lab work today look good.  Please follow-up with your PCP regarding this.  Return to the ED if you have worsening pain despite medications, persistent vomiting, fevers or any other new or concerning symptoms.

## 2019-06-24 NOTE — ED Provider Notes (Signed)
[facility] EMERGENCY DEPARTMENT Provider Note   CSN: [id number] Arrival date & time: 06/24/19  1359     History   Chief Complaint Chief Complaint  Patient presents with   Abdominal Pain    HPI Sharon Lamb is a 58 y.o. female.     Sharon Lamb is a 58 y.o. female with a history of hyperlipidemia, GERD, migraines and fibromyalgia, who presents to the ED for evaluation of left-sided abdominal pain, she has had some intermittent discomfort over the left side of the abdomen and flank for 2 weeks but today pain became significantly worse and is now a constant ache with intermittent severe sharp pains.  Patient reports that she cannot sit still because she cannot find any position that is comfortable.  She denies any nausea or vomiting.  No diarrhea, constipation or blood in the stool.  No dysuria or urinary frequency, no hematuria.  No chest pain or shortness of breath.  No fevers.  Reports that she had a kidney stone once before but it was many many years ago when she cannot remember if this feels similar, reports previous cholecystectomy, gastric sleeve and abdominal hysterectomy.  Has not taken anything for pain prior to arrival.     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/12/2014   NASH (nonalcoholic steatohepatitis) 03/31/2014   Calculus of kidney 03/31/2014   Hemorrhoids, internal 02/08/2014   DDD (degenerative disc disease), cervical 01/03/2014   Acid reflux 01/03/2014   Hypercholesteremia 11/30/2013   Extreme obesity  11/30/2013   Cardiac enlargement 07/22/2013   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      Home Medications    Prior to Admission medications   Medication Sig Start Date End Date Taking? Authorizing Provider  celecoxib (CELEBREX) 200 MG capsule  12/28/14   [provider]  DULoxetine (CYMBALTA) 20 MG capsule Take 20 mg by mouth daily.    [provider]  esomeprazole (NEXIUM) 40 MG capsule TAKE ONE CAPSULE BY MOUTH TWICE DAILY 30 MINUTES BEFORE BREAKFAST AND DINNER 01/01/15   [provider]  estradiol (ESTRACE) 2 MG tablet TAKE 1 TABLET BY MOUTH DAILY FOR MENOPAUSE** STOP THE 1 MG TABLET** 12/04/14 01/30/18  [provider]  fluticasone (FLONASE) 50 MCG/ACT nasal spray Place 2 sprays into both nostrils daily. 11/09/14   Ward, Layla Maw, DO  ibuprofen (ADVIL,MOTRIN) 800 MG tablet Take 1 tablet (800 mg  total) by mouth every 8 (eight) hours as needed. 10/29/15   Clayton Bibles, PA-C  montelukast (SINGULAIR) 10 MG tablet Take 10 mg by mouth at bedtime.    [provider]  ondansetron (ZOFRAN ODT) 4 MG disintegrating tablet 4mg  ODT q4 hours prn nausea/vomit 06/24/19   Jacqlyn Larsen, PA-C  oxyCODONE-acetaminophen (PERCOCET) 5-325 MG tablet Take 1 tablet by mouth every 6 (six) hours as needed. 06/24/19   Jacqlyn Larsen, PA-C  potassium chloride SA (K-DUR,KLOR-CON) 20 MEQ tablet Take 1 tablet (20  mEq total) by mouth daily. 01/30/18   Lajean Saver, MD  simvastatin (ZOCOR) 40 MG tablet  12/28/14   [provider]  tamsulosin (FLOMAX) 0.4 MG CAPS capsule Take 1 capsule (0.4 mg total) by mouth daily. 06/24/19   Jacqlyn Larsen, PA-C  tiZANidine (ZANAFLEX) 2 MG tablet Take 2 mg by mouth 2 (two) times daily.    [provider]  traZODone (DESYREL) 100 MG tablet Take 100 mg by mouth at bedtime.    [provider]  Vitamin D, Ergocalciferol, (DRISDOL) 50000 units CAPS capsule Take 50,000 Units by mouth every 7 (seven) days.    [provider]    Family History No family history on file.  Social History Social History   Tobacco Use   Smoking status: Never Smoker   Smokeless tobacco: Never Used  Substance Use Topics   Alcohol use: No   Drug use: No     Allergies   Tetracyclines & related and Fentanyl   Review of Systems Review of Systems  Constitutional: Negative for chills and fever.  HENT: Negative.   Respiratory: Negative for cough and shortness of breath.   Cardiovascular: Negative for chest pain.  Gastrointestinal: Positive for abdominal pain. Negative for blood in stool, constipation, diarrhea, nausea and vomiting.  Genitourinary: Positive for flank pain. Negative for dysuria, frequency, hematuria, vaginal bleeding and vaginal discharge.  Musculoskeletal: Positive for back pain.  Skin: Negative for color change and rash.  Neurological: Negative for dizziness, syncope and light-headedness.     Physical Exam Updated Vital Signs BP (!) 145/82 (BP Location: Left Arm)    Pulse 75    Temp 99.2 F (37.3 C) (Oral)    Resp 16    Ht 5\' 6"  (1.676 m)    Wt 91.8 kg    SpO2 100%    BMI 32.67 kg/m   Physical Exam Vitals signs and nursing note reviewed.  Constitutional:      General: She is in acute distress.     Appearance: She is well-developed. She is obese. She is not diaphoretic.     Comments: Patient appears extremely uncomfortable,  writhing back and forth in the bed and holding her left side  HENT:     Head: Normocephalic and atraumatic.     Mouth/Throat:     Mouth: Mucous membranes are moist.     Pharynx: Oropharynx is clear.  Eyes:     General:        Right eye: No discharge.        Left eye: No discharge.     Pupils: Pupils are equal, round, and reactive to light.  Neck:     Musculoskeletal: Neck supple.  Cardiovascular:     Rate and Rhythm: Normal rate and regular rhythm.     Heart sounds: Normal heart sounds.  Pulmonary:     Effort: Pulmonary effort is normal. No respiratory distress.     Breath sounds: Normal breath sounds. No wheezing  or rales.     Comments: Respirations equal and unlabored, patient able to speak in full sentences, lungs clear to auscultation bilaterally Abdominal:     General: Bowel sounds are normal. There is no distension.     Palpations: Abdomen is soft. There is no mass.     Tenderness: There is abdominal tenderness in the suprapubic area and left lower quadrant. There is no guarding.     Comments: Abdomen soft and nondistended, bowel sounds present throughout, there is some tenderness in the left lower quadrant and suprapubic region but with no guarding or peritoneal signs there is also some tenderness over the left flank.  Musculoskeletal:        General: No deformity.  Skin:    General: Skin is warm and dry.     Capillary Refill: Capillary refill takes less than 2 seconds.  Neurological:     Mental Status: She is alert.     Coordination: Coordination normal.     Comments: Speech is clear, able to follow commands Moves extremities without ataxia, coordination intact  Psychiatric:        Mood and Affect: Mood normal.        Behavior: Behavior normal.      ED Treatments / Results  Labs (all labs ordered are listed, but only abnormal results are displayed) Labs Reviewed  URINALYSIS, ROUTINE W REFLEX MICROSCOPIC - Abnormal; Notable for the following components:       Result Value   Color, Urine STRAW (*)    APPearance CLOUDY (*)    Hgb urine dipstick MODERATE (*)    All other components within normal limits  COMPREHENSIVE METABOLIC PANEL - Abnormal; Notable for the following components:   Potassium 3.1 (*)    Total Bilirubin 1.3 (*)    All other components within normal limits  CBC WITH DIFFERENTIAL/PLATELET - Abnormal; Notable for the following components:   Hemoglobin 11.2 (*)    HCT 35.7 (*)    All other components within normal limits  URINALYSIS, MICROSCOPIC (REFLEX) - Abnormal; Notable for the following components:   Bacteria, UA FEW (*)    All other components within normal limits  URINE CULTURE  LIPASE, BLOOD    EKG None  Radiology Ct Renal Stone Study  Result Date: 06/24/2019 CLINICAL DATA:  Left flank region pain EXAM: CT ABDOMEN AND PELVIS WITHOUT CONTRAST TECHNIQUE: Multidetector CT imaging of the abdomen and pelvis was performed following the standard protocol without oral or IV contrast. COMPARISON:  August 22, 2018 FINDINGS: Lower chest: There is mild atelectatic change in the anterior left base. Lung bases are otherwise clear. There are foci of coronary artery calcification. Hepatobiliary: The previously noted focus of decreased attenuation in the right lobe of the liver adjacent to the fissure for the ligamentum teres is again noted although much better seen on contrast-enhanced study. This lesion does not appear to have increased in size compared to the previous study. This lesion measures approximately 2.7 x 2.0 cm currently. No new liver lesions are evident on this noncontrast enhanced study. The gallbladder is absent. Common bile duct is mildly enlarged at 11 mm without obstructing lesions seen on noncontrast enhanced CT in the biliary ductal system. No intrahepatic biliary duct dilatation is evident. Pancreas: There is no pancreatic mass or inflammatory focus. Spleen: No splenic lesions are evident. Adrenals/Urinary Tract:  Adrenals bilaterally appear unremarkable. No renal mass evident on either side. There is moderate hydronephrosis on the left. There is no appreciable hydronephrosis on the right.  On the right, there are scattered 1-2 mm calculi throughout the kidney. There is a 3 mm calculus in the mid left kidney with a 1 mm calculus in the lower pole left kidney. There is a 3 mm calculus in the left ureter at the level of L4. No other ureteral calculi are evident on either side. Urinary bladder is midline with wall thickness within normal limits for degree of distention. Stomach/Bowel: There is no appreciable bowel wall or mesenteric thickening. Patient is status post gastric bypass procedure with apparent gastric sleeve resection. No mesenteric or bowel wall thickening with noted in postoperative region. No abnormal fluid. There is no evident bowel obstruction. Terminal ileum appears unremarkable. There is no evident free air or portal venous air. Vascular/Lymphatic: There is no abdominal aortic aneurysm. There are scattered foci of aortic and right common iliac artery atherosclerotic calcification. No adenopathy is evident in the abdomen or pelvis. Reproductive: Uterus is absent.  There is no evident pelvic mass. Other: The appendix appears normal. There is no evident abscess or ascites in the abdomen or pelvis. Musculoskeletal: No blastic or lytic bone lesions. There is no intramuscular or abdominal wall lesion. IMPRESSION: 1. 3 mm calculus in the left ureter at the L4 level causing moderate hydronephrosis on the left. There are nonobstructing calculi in each kidney. Urinary bladder wall thickness is within normal limits for degree of distention. 2. Status post gastric sleeve resection without complicating features. No bowel obstruction. No abscess in the abdomen or pelvis. Appendix appears normal. 3. Gallbladder absent. Mild common bile duct dilatation without obstructing focus evident by CT. If further evaluation of the  common bile duct is felt to be warranted, MRCP would be the imaging study of choice to further evaluate. 4. Mass in the right lobe of the liver near the fissure for the ligamentum teres does not appear significantly changed from prior study. Direct comparison difficult given lack of intravenous contrast currently. 5.  Uterus absent. Electronically Signed   By: Bretta BangWilliam  Woodruff III M.D.   On: 06/24/2019 15:41    Procedures Procedures (including critical care time)  Medications Ordered in ED Medications  HYDROmorphone (DILAUDID) injection 1 mg (1 mg Intravenous Given 06/24/19 1442)  ondansetron (ZOFRAN) injection 4 mg (4 mg Intravenous Given 06/24/19 1440)  sodium chloride 0.9 % bolus 1,000 mL (0 mLs Intravenous Stopped 06/24/19 1612)  potassium chloride SA (K-DUR) CR tablet 40 mEq (40 mEq Oral Given 06/24/19 1530)     Initial Impression / Assessment and Plan / ED Course  I have reviewed the triage vital signs and the nursing notes.  Pertinent labs & imaging results that were available during my care of the patient were reviewed by me and considered in my medical decision making (see chart for details).  58 year old female presents with sudden onset left side pain going from the flank into the lower abdomen.  On arrival she seems very uncomfortable is unable to sit still.  She has normal vitals aside from hypertension.  Presentation very much concerning for possible kidney stone.  Could also be diverticulitis although feel this is less likely.  Patient does not have any pain in the chest.  Will check abdominal labs and CT renal stone study.  Lab work shows no leukocytosis, stable hemoglobin, mild hypokalemia of 3.1 replaced with oral potassium, no other electrolyte derangements, normal renal and liver function normal lipase.  Urinalysis with blood present in the urine but no signs of infection, no bacteria or leukocytes.  CT scan shows  a left-sided ureteral stone with moderate hydronephrosis,  stone is 3 mm, there are also bilateral intrarenal stones.  Incidental finding of slightly widened common bile duct in the setting of cholecystectomy but patient with normal LFTs aside from mildly elevated bilirubin and no focal right upper quadrant tenderness, will liver mass noted as well, notified patient of these findings and will have her follow-up with PCP regarding this.  After 1 dose of pain medication patient has had solution of her pain.  Will discharge home with urine strainer, Flomax, Zofran and pain medication and will have her follow-up with urology.  Return precautions discussed.  Patient expresses understanding and agreement with plan.  Discharged home in good condition.  Final Clinical Impressions(s) / ED Diagnoses   Final diagnoses:  Ureterolithiasis  Left flank pain    ED Discharge Orders         Ordered    ondansetron (ZOFRAN ODT) 4 MG disintegrating tablet     06/24/19 1702    tamsulosin (FLOMAX) 0.4 MG CAPS capsule  Daily     06/24/19 1702    oxyCODONE-acetaminophen (PERCOCET) 5-325 MG tablet  Every 6 hours PRN     06/24/19 1702           Dartha Lodge, PA-C 06/24/19 1732    Jacalyn Lefevre, MD 06/26/19 [id number]

## 2019-06-24 NOTE — ED Notes (Signed)
Patient transported to CT 

## 2019-06-24 NOTE — ED Triage Notes (Signed)
Pt c/o left side abd pain, flank pain x 2 weeks-worse x today-pt entered triage grimacing

## 2019-06-25 LAB — URINE CULTURE

## 2019-06-28 ENCOUNTER — Other Ambulatory Visit: Payer: Self-pay

## 2019-06-28 ENCOUNTER — Encounter (HOSPITAL_BASED_OUTPATIENT_CLINIC_OR_DEPARTMENT_OTHER): Payer: Self-pay

## 2019-06-28 ENCOUNTER — Emergency Department (HOSPITAL_BASED_OUTPATIENT_CLINIC_OR_DEPARTMENT_OTHER)
Admission: EM | Admit: 2019-06-28 | Discharge: 2019-06-29 | Disposition: A | Discharge status: Home / Self Care | Payer: BC Managed Care – PPO | Attending: Emergency Medicine | Admitting: Emergency Medicine

## 2019-06-28 ENCOUNTER — Emergency Department (HOSPITAL_BASED_OUTPATIENT_CLINIC_OR_DEPARTMENT_OTHER): Payer: BC Managed Care – PPO

## 2019-06-28 DIAGNOSIS — Z79899 Other long term (current) drug therapy: Secondary | ICD-10-CM | POA: Insufficient documentation

## 2019-06-28 DIAGNOSIS — N200 Calculus of kidney: Secondary | ICD-10-CM | POA: Insufficient documentation

## 2019-06-28 HISTORY — DX: Calculus of kidney: N20.0

## 2019-06-28 LAB — URINALYSIS, MICROSCOPIC (REFLEX)

## 2019-06-28 LAB — URINALYSIS, ROUTINE W REFLEX MICROSCOPIC
Bilirubin Urine: NEGATIVE
Glucose, UA: NEGATIVE mg/dL
Ketones, ur: NEGATIVE mg/dL
Leukocytes,Ua: NEGATIVE
Nitrite: NEGATIVE
Protein, ur: NEGATIVE mg/dL
Specific Gravity, Urine: 1.01 (ref 1.005–1.030)
pH: 6.5 (ref 5.0–8.0)

## 2019-06-28 MED ORDER — KETOROLAC TROMETHAMINE 60 MG/2ML IM SOLN
30.0000 mg | Freq: Once | INTRAMUSCULAR | Status: AC
  Administered 2019-06-28: 30 mg via INTRAMUSCULAR
  Filled 2019-06-28: qty 2

## 2019-06-28 MED ORDER — DICLOFENAC SODIUM ER 100 MG PO TB24: 100.0000 mg | ORAL_TABLET | Freq: Every day | ORAL | 0 refills | Status: AC

## 2019-06-28 MED ORDER — OXYCODONE HCL 5 MG PO TABS
5.0000 mg | ORAL_TABLET | ORAL | Status: AC
  Administered 2019-06-28: 5 mg via ORAL
  Filled 2019-06-28: qty 1

## 2019-06-28 MED ORDER — TAMSULOSIN HCL 0.4 MG PO CAPS
0.4000 mg | ORAL_CAPSULE | ORAL | Status: AC
  Administered 2019-06-28: 0.4 mg via ORAL
  Filled 2019-06-28: qty 1

## 2019-06-28 NOTE — ED Triage Notes (Signed)
Pt c/o left flank/abd pain x today-states she was dx with kidney stone 9/11-urology appt next week-last does oxycodone 1 hour PTA

## 2019-06-29 ENCOUNTER — Encounter (HOSPITAL_BASED_OUTPATIENT_CLINIC_OR_DEPARTMENT_OTHER): Payer: Self-pay | Admitting: Emergency Medicine

## 2019-06-29 NOTE — ED Notes (Signed)
ED Provider at bedside. 

## 2019-06-29 NOTE — ED Provider Notes (Signed)
[facility] EMERGENCY DEPARTMENT Provider Note   CSN: [id number] Arrival date & time: 06/28/19  2119     History   Chief Complaint Chief Complaint  Patient presents with   Nephrolithiasis    HPI Sharon Lamb is a 58 y.o. female.     The history is provided by the patient.  Flank Pain This is a new problem. The current episode started more than 1 week ago (at least 2 weeks). The problem occurs constantly. The problem has not changed since onset.Pertinent negatives include no chest pain, no headaches and no shortness of breath. Nothing aggravates the symptoms. Nothing relieves the symptoms. Treatments tried: percocet and flomax  The treatment provided no relief.  Patient with kidney stones and fibromyalgia seen and diagnosed with left sided stone on 9/11.  Sent home on percocet and flomax but pain persists and has not seen her urologist yet.  No n/v/d.  No dysuria.    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   NASH (nonalcoholic steatohepatitis) 03/31/2014   Calculus of kidney 03/31/2014   Hemorrhoids, internal 02/08/2014   DDD (degenerative disc disease), cervical 01/03/2014   Acid reflux 01/03/2014   Hypercholesteremia 11/30/2013   Extreme obesity 11/30/2013   Cardiac enlargement 07/22/2013   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      Home Medications    Prior to Admission medications   Medication Sig Start Date End Date Taking? Authorizing Provider  celecoxib (CELEBREX) 200 MG capsule  12/28/14   [provider]  Diclofenac Sodium CR 100 MG 24 hr tablet Take 1 tablet (100 mg total) by mouth daily. 06/28/19   Natavia Sublette, MD  DULoxetine (CYMBALTA) 20 MG capsule Take 20 mg by mouth daily.    [provider]  esomeprazole (NEXIUM) 40 MG capsule TAKE ONE CAPSULE BY MOUTH TWICE DAILY 30 MINUTES BEFORE BREAKFAST AND DINNER 01/01/15   [provider]  estradiol (ESTRACE) 2 MG tablet TAKE 1 TABLET BY MOUTH DAILY FOR MENOPAUSE** STOP THE 1 MG TABLET** 12/04/14 01/30/18  [provider]  fluticasone (FLONASE) 50 MCG/ACT nasal spray Place 2 sprays into both nostrils daily. 11/09/14   Ward, Layla MawKristen N, DO  ibuprofen (ADVIL,MOTRIN) 800 MG tablet Take 1 tablet (800 mg total) by mouth every 8 (eight) hours as needed. 10/29/15   Trixie DredgeWest, Emily, PA-C  montelukast (SINGULAIR) 10 MG tablet  Take 10 mg by mouth at bedtime.    [provider]  ondansetron (ZOFRAN ODT) 4 MG disintegrating tablet 4mg  ODT q4 hours prn nausea/vomit 06/24/19   Dartha Lodge, PA-C  oxyCODONE-acetaminophen (PERCOCET) 5-325 MG tablet Take 1 tablet by mouth every 6 (six) hours as needed. 06/24/19   Dartha Lodge, PA-C  potassium chloride SA (K-DUR,KLOR-CON) 20 MEQ tablet Take 1 tablet (20 mEq total) by mouth daily. 01/30/18   Cathren Laine, MD  simvastatin (ZOCOR) 40 MG tablet  12/28/14   [provider]  tamsulosin (FLOMAX)  0.4 MG CAPS capsule Take 1 capsule (0.4 mg total) by mouth daily. 06/24/19   Dartha Lodge, PA-C  tiZANidine (ZANAFLEX) 2 MG tablet Take 2 mg by mouth 2 (two) times daily.    [provider]  traZODone (DESYREL) 100 MG tablet Take 100 mg by mouth at bedtime.    [provider]  Vitamin D, Ergocalciferol, (DRISDOL) 50000 units CAPS capsule Take 50,000 Units by mouth every 7 (seven) days.    [provider]    Family History No family history on file.  Social History Social History   Tobacco Use   Smoking status: Never Smoker   Smokeless tobacco: Never Used  Substance Use Topics   Alcohol use: No   Drug use: No     Allergies   Tetracyclines & related and Fentanyl   Review of Systems Review of Systems  Constitutional: Negative for fever.  HENT: Negative for congestion.   Eyes: Negative for visual disturbance.  Respiratory: Negative for shortness of breath.   Cardiovascular: Negative for chest pain.  Gastrointestinal: Negative for constipation and vomiting.  Genitourinary: Positive for flank pain.  Musculoskeletal: Negative for arthralgias.  Neurological: Negative for headaches.  Psychiatric/Behavioral: Negative for agitation.     Physical Exam Updated Vital Signs BP (!) 148/76    Pulse (!) 50    Temp 99 F (37.2 C) (Oral)    Resp 16    Ht 5\' 5"  (1.651 m)    Wt 93.9 kg    SpO2 100%    BMI 34.45 kg/m   Physical Exam Vitals signs and nursing note reviewed.  Constitutional:      General: She is not in acute distress. HENT:     Head: Normocephalic and atraumatic.     Nose: Nose normal.  Eyes:     Conjunctiva/sclera: Conjunctivae normal.     Pupils: Pupils are equal, round, and reactive to light.  Neck:     Musculoskeletal: Normal range of motion and neck supple.  Cardiovascular:     Rate and Rhythm: Normal rate and regular rhythm.     Pulses: Normal pulses.     Heart sounds: Normal heart sounds.  Pulmonary:     Effort: Pulmonary  effort is normal.     Breath sounds: Normal breath sounds.  Abdominal:     General: Abdomen is flat. Bowel sounds are normal.     Tenderness: There is no abdominal tenderness. There is no guarding or rebound.  Musculoskeletal: Normal range of motion.  Skin:    General: Skin is warm and dry.     Capillary Refill: Capillary refill takes less than 2 seconds.  Neurological:     General: No focal deficit present.     Mental Status: She is alert and oriented to person, place, and time.  Psychiatric:        Mood and Affect: Mood normal.        Behavior: Behavior  normal.      ED Treatments / Results  Labs (all labs ordered are listed, but only abnormal results are displayed) Results for orders placed or performed during the hospital encounter of 06/28/19  Urinalysis, Routine w reflex microscopic- may I&O cath if menses  Result Value Ref Range   Color, Urine YELLOW YELLOW   APPearance CLEAR CLEAR   Specific Gravity, Urine 1.010 1.005 - 1.030   pH 6.5 5.0 - 8.0   Glucose, UA NEGATIVE NEGATIVE mg/dL   Hgb urine dipstick SMALL (A) NEGATIVE   Bilirubin Urine NEGATIVE NEGATIVE   Ketones, ur NEGATIVE NEGATIVE mg/dL   Protein, ur NEGATIVE NEGATIVE mg/dL   Nitrite NEGATIVE NEGATIVE   Leukocytes,Ua NEGATIVE NEGATIVE  Urinalysis, Microscopic (reflex)  Result Value Ref Range   RBC / HPF 11-20 0 - 5 RBC/hpf   WBC, UA 0-5 0 - 5 WBC/hpf   Bacteria, UA FEW (A) NONE SEEN   Squamous Epithelial / LPF 0-5 0 - 5   Dg Abdomen 1 View  Result Date: 06/28/2019 CLINICAL DATA:  Left flank and abdominal pain EXAM: ABDOMEN - 1 VIEW COMPARISON:  CT 06/24/2019 FINDINGS: Surgical clips in the right upper quadrant.nonobstructed bowel-gas pattern. Postsurgical changes in the upper abdomen. Possible faint 3 mm calcification overlying the left upper sacrum, could reflect distal migration of previously noted left ureteral stone. Numerous small stones overlying the right kidney. Multiple right pelvic phleboliths.  IMPRESSION: 1. Nonobstructed gas pattern 2. Possible faint 3 mm calcification overlying the left upper sacrum, could reflect distal migration of previously noted left ureteral stone 3. Right kidney stones Electronically Signed   By: Jasmine PangKim  Fujinaga M.D.   On: 06/28/2019 23:52   Ct Renal Stone Study  Result Date: 06/24/2019 CLINICAL DATA:  Left flank region pain EXAM: CT ABDOMEN AND PELVIS WITHOUT CONTRAST TECHNIQUE: Multidetector CT imaging of the abdomen and pelvis was performed following the standard protocol without oral or IV contrast. COMPARISON:  August 22, 2018 FINDINGS: Lower chest: There is mild atelectatic change in the anterior left base. Lung bases are otherwise clear. There are foci of coronary artery calcification. Hepatobiliary: The previously noted focus of decreased attenuation in the right lobe of the liver adjacent to the fissure for the ligamentum teres is again noted although much better seen on contrast-enhanced study. This lesion does not appear to have increased in size compared to the previous study. This lesion measures approximately 2.7 x 2.0 cm currently. No new liver lesions are evident on this noncontrast enhanced study. The gallbladder is absent. Common bile duct is mildly enlarged at 11 mm without obstructing lesions seen on noncontrast enhanced CT in the biliary ductal system. No intrahepatic biliary duct dilatation is evident. Pancreas: There is no pancreatic mass or inflammatory focus. Spleen: No splenic lesions are evident. Adrenals/Urinary Tract: Adrenals bilaterally appear unremarkable. No renal mass evident on either side. There is moderate hydronephrosis on the left. There is no appreciable hydronephrosis on the right. On the right, there are scattered 1-2 mm calculi throughout the kidney. There is a 3 mm calculus in the mid left kidney with a 1 mm calculus in the lower pole left kidney. There is a 3 mm calculus in the left ureter at the level of L4. No other ureteral  calculi are evident on either side. Urinary bladder is midline with wall thickness within normal limits for degree of distention. Stomach/Bowel: There is no appreciable bowel wall or mesenteric thickening. Patient is status post gastric bypass procedure with apparent gastric sleeve  resection. No mesenteric or bowel wall thickening with noted in postoperative region. No abnormal fluid. There is no evident bowel obstruction. Terminal ileum appears unremarkable. There is no evident free air or portal venous air. Vascular/Lymphatic: There is no abdominal aortic aneurysm. There are scattered foci of aortic and right common iliac artery atherosclerotic calcification. No adenopathy is evident in the abdomen or pelvis. Reproductive: Uterus is absent.  There is no evident pelvic mass. Other: The appendix appears normal. There is no evident abscess or ascites in the abdomen or pelvis. Musculoskeletal: No blastic or lytic bone lesions. There is no intramuscular or abdominal wall lesion. IMPRESSION: 1. 3 mm calculus in the left ureter at the L4 level causing moderate hydronephrosis on the left. There are nonobstructing calculi in each kidney. Urinary bladder wall thickness is within normal limits for degree of distention. 2. Status post gastric sleeve resection without complicating features. No bowel obstruction. No abscess in the abdomen or pelvis. Appendix appears normal. 3. Gallbladder absent. Mild common bile duct dilatation without obstructing focus evident by CT. If further evaluation of the common bile duct is felt to be warranted, MRCP would be the imaging study of choice to further evaluate. 4. Mass in the right lobe of the liver near the fissure for the ligamentum teres does not appear significantly changed from prior study. Direct comparison difficult given lack of intravenous contrast currently. 5.  Uterus absent. Electronically Signed   By: Lowella Grip III M.D.   On: 06/24/2019 15:41  ]  Radiology Dg  Abdomen 1 View  Result Date: 06/28/2019 CLINICAL DATA:  Left flank and abdominal pain EXAM: ABDOMEN - 1 VIEW COMPARISON:  CT 06/24/2019 FINDINGS: Surgical clips in the right upper quadrant.nonobstructed bowel-gas pattern. Postsurgical changes in the upper abdomen. Possible faint 3 mm calcification overlying the left upper sacrum, could reflect distal migration of previously noted left ureteral stone. Numerous small stones overlying the right kidney. Multiple right pelvic phleboliths. IMPRESSION: 1. Nonobstructed gas pattern 2. Possible faint 3 mm calcification overlying the left upper sacrum, could reflect distal migration of previously noted left ureteral stone 3. Right kidney stones Electronically Signed   By: Donavan Foil M.D.   On: 06/28/2019 23:52    Procedures Procedures (including critical care time)  Medications Ordered in ED Medications  ketorolac (TORADOL) injection 30 mg (30 mg Intramuscular Given 06/28/19 2357)  tamsulosin (FLOMAX) capsule 0.4 mg (0.4 mg Oral Given 06/28/19 2353)  oxyCODONE (Oxy IR/ROXICODONE) immediate release tablet 5 mg (5 mg Oral Given 06/28/19 2351)    Pain medication given in the ED.   Based on KUB stone appears to have moved. PMP reviewed and patient has 2 outstanding narcotic prescriptions.  Will add voltaren to flomax and home narcotics.  Continue to strain urine and follow up with urology. No signs of infection.  Patient is tolerating POs in the the ED.    Sharon Lamb was evaluated in Emergency Department on 06/29/2019 for the symptoms described in the history of present illness. She was evaluated in the context of the global COVID-19 pandemic, which necessitated consideration that the patient might be at risk for infection with the SARS-CoV-2 virus that causes COVID-19. Institutional protocols and algorithms that pertain to the evaluation of patients at risk for COVID-19 are in a state of rapid change based on information released by regulatory bodies  including the CDC and federal and state organizations. These policies and algorithms were followed during the patient's care in the ED.   Final Clinical Impressions(s) /  ED Diagnoses   Final diagnoses:  Nephrolithiasis  Return for intractable cough, coughing up blood,fevers >100.4 unrelieved by medication, shortness of breath, intractable vomiting, chest pain, shortness of breath, weakness,numbness, changes in speech, facial asymmetry,abdominal pain, passing out,Inability to tolerate liquids or food, cough, altered mental status or any concerns. No signs of systemic illness or infection. The patient is nontoxic-appearing on exam and vital signs are within normal limits.   I have reviewed the triage vital signs and the nursing notes. Pertinent labs &imaging results that were available during my care of the patient were reviewed by me and considered in my medical decision making (see chart for details).After history, exam, and medical workup I feel the patient has beenappropriately medically screened and is safe for discharge home. Pertinent diagnoses were discussed with the patient. Patient was given return precautions.  ED Discharge Orders         Ordered    Diclofenac Sodium CR 100 MG 24 hr tablet  Daily     06/28/19 2359           Zenita Kister, MD 06/29/19 0401

## 2019-12-15 ENCOUNTER — Encounter (HOSPITAL_BASED_OUTPATIENT_CLINIC_OR_DEPARTMENT_OTHER): Payer: Self-pay | Admitting: *Deleted

## 2019-12-15 ENCOUNTER — Emergency Department (HOSPITAL_BASED_OUTPATIENT_CLINIC_OR_DEPARTMENT_OTHER): Payer: BC Managed Care – PPO

## 2019-12-15 ENCOUNTER — Emergency Department (HOSPITAL_BASED_OUTPATIENT_CLINIC_OR_DEPARTMENT_OTHER)
Admission: EM | Admit: 2019-12-15 | Discharge: 2019-12-15 | Disposition: A | Discharge status: Home / Self Care | Payer: BC Managed Care – PPO | Attending: Emergency Medicine | Admitting: Emergency Medicine

## 2019-12-15 ENCOUNTER — Other Ambulatory Visit: Payer: Self-pay

## 2019-12-15 DIAGNOSIS — R109 Unspecified abdominal pain: Secondary | ICD-10-CM | POA: Diagnosis present

## 2019-12-15 DIAGNOSIS — M545 Low back pain, unspecified: Secondary | ICD-10-CM

## 2019-12-15 DIAGNOSIS — Z79899 Other long term (current) drug therapy: Secondary | ICD-10-CM | POA: Insufficient documentation

## 2019-12-15 LAB — BASIC METABOLIC PANEL
Anion gap: 7 (ref 5–15)
BUN: 16 mg/dL (ref 6–20)
CO2: 26 mmol/L (ref 22–32)
Calcium: 9 mg/dL (ref 8.9–10.3)
Chloride: 104 mmol/L (ref 98–111)
Creatinine, Ser: 0.94 mg/dL (ref 0.44–1.00)
GFR calc Af Amer: >60 mL/min (ref >60)
GFR calc non Af Amer: >60 mL/min (ref >60)
Glucose, Bld: 97 mg/dL (ref 70–99)
Potassium: 3.5 mmol/L (ref 3.5–5.1)
Sodium: 137 mmol/L (ref 135–145)

## 2019-12-15 LAB — CBC WITH DIFFERENTIAL/PLATELET
Abs Immature Granulocytes: 0 10*3/uL (ref 0.00–0.07)
Basophils Absolute: 0 10*3/uL (ref 0.0–0.1)
Basophils Relative: 1 %
Eosinophils Absolute: 0 10*3/uL (ref 0.0–0.5)
Eosinophils Relative: 1 %
HCT: 35.1 % — ABNORMAL LOW (ref 36.0–46.0)
Hemoglobin: 11.1 g/dL — ABNORMAL LOW (ref 12.0–15.0)
Immature Granulocytes: 0 %
Lymphocytes Relative: 50 %
Lymphs Abs: 3.2 10*3/uL (ref 0.7–4.0)
MCH: 28.2 pg (ref 26.0–34.0)
MCHC: 31.6 g/dL (ref 30.0–36.0)
MCV: 89.1 fL (ref 80.0–100.0)
Monocytes Absolute: 0.4 10*3/uL (ref 0.1–1.0)
Monocytes Relative: 6 %
Neutro Abs: 2.6 10*3/uL (ref 1.7–7.7)
Neutrophils Relative %: 42 %
Platelets: 223 10*3/uL (ref 150–400)
RBC: 3.94 MIL/uL (ref 3.87–5.11)
RDW: 13 % (ref 11.5–15.5)
WBC: 6.2 10*3/uL (ref 4.0–10.5)
nRBC: 0 % (ref 0.0–0.2)

## 2019-12-15 LAB — URINALYSIS, ROUTINE W REFLEX MICROSCOPIC
Bilirubin Urine: NEGATIVE
Glucose, UA: NEGATIVE mg/dL
Hgb urine dipstick: NEGATIVE
Ketones, ur: NEGATIVE mg/dL
Leukocytes,Ua: NEGATIVE
Nitrite: NEGATIVE
Protein, ur: NEGATIVE mg/dL
Specific Gravity, Urine: 1.02 (ref 1.005–1.030)
pH: 6.5 (ref 5.0–8.0)

## 2019-12-15 LAB — D-DIMER, QUANTITATIVE: D-Dimer, Quant: 0.59 ug/mL-FEU — ABNORMAL HIGH (ref 0.00–0.50)

## 2019-12-15 MED ORDER — IOHEXOL 350 MG/ML SOLN
75.0000 mL | Freq: Once | INTRAVENOUS | Status: AC | PRN
  Administered 2019-12-15: 75 mL via INTRAVENOUS

## 2019-12-15 NOTE — ED Provider Notes (Signed)
[facility] EMERGENCY DEPARTMENT Provider Note   CSN: [id number] Arrival date & time: 12/15/19  1839     History Chief Complaint  Patient presents with  . Flank Pain    Sharon Lamb is a 59 y.o. female.  Patient is a 59 year old female who presents with pain in her left back.  She said it started suddenly earlier this evening.  She was not do anything when it started.  There is no radiation around to her abdomen.  She does not report anything that makes it better or worse, other than she does say it is worse if she takes a big breath.  She denies any shortness of breath.  She has no associated nausea or vomiting.  No known fevers.  No leg swelling.  She has not taking thing at home for the pain.  No similar symptoms in the past.  She does have a history of kidney stones but does not really know if this produced similar symptoms.        Past Medical History:  Diagnosis Date  . Arthritis   . Fibromyalgia   . GERD (gastroesophageal reflux disease)   . History of degenerative disc disease   . Hypercholesteremia   . Kidney stone   . Migraine     Patient Active Problem List   Diagnosis Date Noted  . Breast lump 01/15/2015  . Achilles tendon sprain 01/15/2015  . Breast pain 01/15/2015  . Decreased potassium in the blood 01/12/2015  . Right ankle injury 11/21/2014  . Hypertensive pulmonary vascular disease (HCC) 09/12/2014  . NASH (nonalcoholic steatohepatitis) 03/31/2014  . Calculus of kidney 03/31/2014  . Hemorrhoids, internal 02/08/2014  . DDD (degenerative disc disease), cervical 01/03/2014  . Acid reflux 01/03/2014  . Hypercholesteremia 11/30/2013  . Extreme obesity 11/30/2013  . Cardiac enlargement 07/22/2013  . 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 Allergic rhinitis 03/19/2013  . Chronic nonalcoholic liver disease 03/19/2013  . Generalized OA 03/19/2013  . Headache, migraine 03/19/2013  . Obstructive apnea  03/19/2013  . 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 ABDOMINAL HYSTERECTOMY    . ACHILLES TENDON REPAIR Right 2016  . BREAST CYST EXCISION    . LAPAROSCOPIC GASTRIC SLEEVE RESECTION    . right wrist surgery    . ROTATOR CUFF REPAIR Right 2018  . TUBAL LIGATION       OB History   No obstetric history on file.     No family history on file.  Social History   Tobacco Use  . Smoking status: Never Smoker  . Smokeless tobacco: Never Used  Substance Use Topics  . Alcohol use: No  . Drug use: No    Home Medications Prior to Admission medications   Medication Sig Start Date End Date Taking? Authorizing Provider  celecoxib (CELEBREX) 200 MG capsule  12/28/14   [provider]  Diclofenac Sodium CR 100 MG 24 hr tablet Take 1 tablet (100 mg total) by mouth daily. 06/28/19   Palumbo, April, MD  DULoxetine (CYMBALTA) 20 MG capsule Take 20 mg by mouth daily.    [provider]  esomeprazole (NEXIUM) 40 MG capsule TAKE ONE CAPSULE BY MOUTH TWICE DAILY 30 MINUTES BEFORE BREAKFAST AND DINNER 01/01/15   [provider]  estradiol (ESTRACE) 2 MG tablet TAKE 1 TABLET BY MOUTH DAILY FOR MENOPAUSE** STOP THE 1 MG TABLET** 12/04/14 01/30/18  [provider]  fluticasone (  FLONASE) 50 MCG/ACT nasal spray Place 2 sprays into both nostrils daily. 11/09/14   Ward, Layla Maw, DO  ibuprofen (ADVIL,MOTRIN) 800 MG tablet Take 1 tablet (800 mg total) by mouth every 8 (eight) hours as needed. 10/29/15   Trixie Dredge, PA-C  montelukast (SINGULAIR) 10 MG tablet Take 10 mg by mouth at bedtime.    [provider]  ondansetron (ZOFRAN ODT) 4 MG disintegrating tablet 4mg  ODT q4 hours prn nausea/vomit 06/24/19   08/24/19, PA-C  oxyCODONE-acetaminophen (PERCOCET) 5-325 MG tablet Take 1 tablet by mouth every 6 (six) hours as needed. 06/24/19   08/24/19, PA-C  potassium chloride SA (K-DUR,KLOR-CON) 20 MEQ tablet Take 1  tablet (20 mEq total) by mouth daily. 01/30/18   02/01/18, MD  simvastatin (ZOCOR) 40 MG tablet  12/28/14   [provider]  tamsulosin (FLOMAX) 0.4 MG CAPS capsule Take 1 capsule (0.4 mg total) by mouth daily. 06/24/19   08/24/19, PA-C  tiZANidine (ZANAFLEX) 2 MG tablet Take 2 mg by mouth 2 (two) times daily.    [provider]  traZODone (DESYREL) 100 MG tablet Take 100 mg by mouth at bedtime.    [provider]  Vitamin D, Ergocalciferol, (DRISDOL) 50000 units CAPS capsule Take 50,000 Units by mouth every 7 (seven) days.    [provider]    Allergies    Tetracyclines & related and Fentanyl  Review of Systems   Review of Systems  Constitutional: Negative for chills, diaphoresis, fatigue and fever.  HENT: Negative for congestion, rhinorrhea and sneezing.   Eyes: Negative.   Respiratory: Negative for cough, chest tightness and shortness of breath.   Cardiovascular: Negative for chest pain and leg swelling.  Gastrointestinal: Negative for abdominal pain, blood in stool, diarrhea, nausea and vomiting.  Genitourinary: Negative for difficulty urinating, flank pain, frequency and hematuria.  Musculoskeletal: Positive for back pain. Negative for arthralgias.  Skin: Negative for rash.  Neurological: Negative for dizziness, speech difficulty, weakness, numbness and headaches.    Physical Exam Updated Vital Signs BP (!) 139/59   Pulse (!) 42 Comment: hx of bradycardia  Temp 98.1 F (36.7 C) (Oral)   Resp 14   Ht 5\' 6"  (1.676 m)   Wt 93 kg   SpO2 100%   BMI 33.09 kg/m   Physical Exam Constitutional:      Appearance: She is well-developed.  HENT:     Head: Normocephalic and atraumatic.  Eyes:     Pupils: Pupils are equal, round, and reactive to light.  Cardiovascular:     Rate and Rhythm: Normal rate and regular rhythm.     Heart sounds: Normal heart sounds.  Pulmonary:     Effort: Pulmonary effort is normal. No respiratory distress.      Breath sounds: Normal breath sounds. No wheezing or rales.  Chest:     Chest wall: No tenderness.  Abdominal:     General: Bowel sounds are normal.     Palpations: Abdomen is soft.     Tenderness: There is no abdominal tenderness. There is no guarding or rebound.  Musculoskeletal:        General: Normal range of motion.     Cervical back: Normal range of motion and neck supple.     Comments: Positive tenderness to her left mid back, no rashes, no crepitus or deformity, no midline tenderness  Lymphadenopathy:     Cervical: No cervical adenopathy.  Skin:    General: Skin is warm  and dry.     Findings: No rash.  Neurological:     Mental Status: She is alert and oriented to person, place, and time.     ED Results / Procedures / Treatments   Labs (all labs ordered are listed, but only abnormal results are displayed) Labs Reviewed  CBC WITH DIFFERENTIAL/PLATELET - Abnormal; Notable for the following components:      Result Value   Hemoglobin 11.1 (*)    HCT 35.1 (*)    All other components within normal limits  D-DIMER, QUANTITATIVE (NOT AT [facility]) - Abnormal; Notable for the following components:   D-Dimer, Quant 0.59 (*)    All other components within normal limits  URINALYSIS, ROUTINE W REFLEX MICROSCOPIC  BASIC METABOLIC PANEL    EKG None  Radiology CT Angio Chest PE W/Cm &/Or Wo Cm  Result Date: 12/15/2019 CLINICAL DATA:  Positive D-dimer. Sudden onset of left chest and flank pain. EXAM: CT ANGIOGRAPHY CHEST WITH CONTRAST TECHNIQUE: Multidetector CT imaging of the chest was performed using the standard protocol during bolus administration of intravenous contrast. Multiplanar CT image reconstructions and MIPs were obtained to evaluate the vascular anatomy. CONTRAST:  9mL OMNIPAQUE IOHEXOL 350 MG/ML SOLN COMPARISON:  Radiography 01/30/2018 FINDINGS: Cardiovascular: Pulmonary arterial opacification is good. There are no pulmonary emboli. There is minimal aortic  atherosclerosis. Heart size is normal. There is some coronary artery calcification. Mediastinum/Nodes: No mass or lymphadenopathy. Lungs/Pleura: Normal Upper Abdomen: Previous bariatric surgery.  No acute finding. Musculoskeletal: Ordinary mild thoracic degenerative changes. Review of the MIP images confirms the above findings. IMPRESSION: No pulmonary emboli or other acute chest pathology. Minimal aortic atherosclerotic plaque. Patient does have some coronary artery calcification. Electronically Signed   By: Paulina Fusi M.D.   On: 12/15/2019 20:50    Procedures Procedures (including critical care time)  Medications Ordered in ED Medications  iohexol (OMNIPAQUE) 350 MG/ML injection 75 mL (75 mLs Intravenous Contrast Given 12/15/19 2039)    ED Course  I have reviewed the triage vital signs and the nursing notes.  Pertinent labs & imaging results that were available during my care of the patient were reviewed by me and considered in my medical decision making (see chart for details).    MDM Rules/Calculators/A&P                      Patient is a 58 year old female who presents with mid left back pain.  It was worse when she takes a deep breath I did do a D-dimer which was positive.  CT scan of her chest shows no evidence of PE.  There is no associate abdominal pain.  Her urine has no hematuria.  Given this, I feel like there is a low likelihood that it is a kidney stone.  It is reproducible on palpation.  When I went back and reexamined her, she said it really was not hurting but then when I started pushing on her mid back it seemed to her hurt again.  I feel like it is likely musculoskeletal.  There is no rash that would be concerning for shingles.  There is no other intra-abdominal findings.  She denies any need for pain medication.  Her other labs are nonconcerning.  She was discharged home in good condition.  She was encouraged to follow-up with her PCP if her symptoms are improving or return  here as needed for any worsening symptoms. Final Clinical Impression(s) / ED Diagnoses Final diagnoses:  Acute left-sided low back  pain without sciatica    Rx / DC Orders ED Discharge Orders    None       Malvin Johns, MD 12/15/19 2135

## 2019-12-15 NOTE — ED Triage Notes (Signed)
Sudden onset left flank pain an hour ago. Hx of kidney stones.

## 2020-01-14 ENCOUNTER — Ambulatory Visit: Payer: BC Managed Care – PPO | Attending: Internal Medicine

## 2020-01-14 DIAGNOSIS — Z23 Encounter for immunization: Secondary | ICD-10-CM

## 2020-01-14 NOTE — Progress Notes (Signed)
   Covid-19 Vaccination Clinic  Name:  Qiara Minetti    MRN: [id number] DOB: 1961/08/26  01/14/2020  Ms. Raimer was observed post Covid-19 immunization for 15 minutes without incident. She was provided with Vaccine Information Sheet and instruction to access the V-Safe system.   Ms. Gwinner was instructed to call 911 with any severe reactions post vaccine: Marland Kitchen Difficulty breathing  . Swelling of face and throat  . A fast heartbeat  . A bad rash all over body  . Dizziness and weakness   Immunizations Administered    Name Date Dose VIS Date Route   Pfizer COVID-19 Vaccine 01/14/2020 10:15 AM 0.3 mL 09/23/2019 Intramuscular   Manufacturer: [employer], [employer]   Lot: [id number]   NDC: [address]-0

## 2020-02-07 ENCOUNTER — Ambulatory Visit: Payer: BC Managed Care – PPO | Attending: Internal Medicine

## 2020-02-07 DIAGNOSIS — Z23 Encounter for immunization: Secondary | ICD-10-CM

## 2020-02-07 NOTE — Progress Notes (Signed)
   Covid-19 Vaccination Clinic  Name:  Nadean Montanaro    MRN: [id number] DOB: 1961/04/24  02/07/2020  Ms. Passe was observed post Covid-19 immunization for 15 minutes without incident. She was provided with Vaccine Information Sheet and instruction to access the V-Safe system.   Ms. Amara was instructed to call 911 with any severe reactions post vaccine: Marland Kitchen Difficulty breathing  . Swelling of face and throat  . A fast heartbeat  . A bad rash all over body  . Dizziness and weakness   Immunizations Administered    Name Date Dose VIS Date Route   Pfizer COVID-19 Vaccine 02/07/2020  9:40 AM 0.3 mL 12/07/2018 Intramuscular   Manufacturer: [employer], [employer]   Lot: [id number]   NDC: [address]-3

## 2021-01-24 ENCOUNTER — Emergency Department (HOSPITAL_BASED_OUTPATIENT_CLINIC_OR_DEPARTMENT_OTHER): Payer: BC Managed Care – PPO

## 2021-01-24 ENCOUNTER — Emergency Department (HOSPITAL_BASED_OUTPATIENT_CLINIC_OR_DEPARTMENT_OTHER)
Admission: EM | Admit: 2021-01-24 | Discharge: 2021-01-25 | Disposition: A | Discharge status: Home / Self Care | Payer: BC Managed Care – PPO | Attending: Emergency Medicine | Admitting: Emergency Medicine

## 2021-01-24 ENCOUNTER — Encounter (HOSPITAL_BASED_OUTPATIENT_CLINIC_OR_DEPARTMENT_OTHER): Payer: Self-pay | Admitting: *Deleted

## 2021-01-24 ENCOUNTER — Other Ambulatory Visit: Payer: Self-pay

## 2021-01-24 DIAGNOSIS — N200 Calculus of kidney: Secondary | ICD-10-CM | POA: Insufficient documentation

## 2021-01-24 DIAGNOSIS — K219 Gastro-esophageal reflux disease without esophagitis: Secondary | ICD-10-CM | POA: Diagnosis not present

## 2021-01-24 DIAGNOSIS — R109 Unspecified abdominal pain: Secondary | ICD-10-CM | POA: Diagnosis present

## 2021-01-24 LAB — URINALYSIS, ROUTINE W REFLEX MICROSCOPIC
Bilirubin Urine: NEGATIVE
Glucose, UA: NEGATIVE mg/dL
Ketones, ur: NEGATIVE mg/dL
Leukocytes,Ua: NEGATIVE
Nitrite: NEGATIVE
Protein, ur: NEGATIVE mg/dL
Specific Gravity, Urine: 1.025 (ref 1.005–1.030)
pH: 6.5 (ref 5.0–8.0)

## 2021-01-24 LAB — CBC
HCT: 36.9 % (ref 36.0–46.0)
Hemoglobin: 11.7 g/dL — ABNORMAL LOW (ref 12.0–15.0)
MCH: 27.7 pg (ref 26.0–34.0)
MCHC: 31.7 g/dL (ref 30.0–36.0)
MCV: 87.4 fL (ref 80.0–100.0)
Platelets: 304 10*3/uL (ref 150–400)
RBC: 4.22 MIL/uL (ref 3.87–5.11)
RDW: 13.2 % (ref 11.5–15.5)
WBC: 7.7 10*3/uL (ref 4.0–10.5)
nRBC: 0 % (ref 0.0–0.2)

## 2021-01-24 LAB — COMPREHENSIVE METABOLIC PANEL
ALT: 13 U/L (ref 0–44)
AST: 16 U/L (ref 15–41)
Albumin: 3.7 g/dL (ref 3.5–5.0)
Alkaline Phosphatase: 70 U/L (ref 38–126)
Anion gap: 8 (ref 5–15)
BUN: 15 mg/dL (ref 6–20)
CO2: 23 mmol/L (ref 22–32)
Calcium: 8.7 mg/dL — ABNORMAL LOW (ref 8.9–10.3)
Chloride: 106 mmol/L (ref 98–111)
Creatinine, Ser: 0.8 mg/dL (ref 0.44–1.00)
GFR, Estimated: >60 mL/min (ref >60)
Glucose, Bld: 89 mg/dL (ref 70–99)
Potassium: 3.7 mmol/L (ref 3.5–5.1)
Sodium: 137 mmol/L (ref 135–145)
Total Bilirubin: 0.4 mg/dL (ref 0.3–1.2)
Total Protein: 6.8 g/dL (ref 6.5–8.1)

## 2021-01-24 LAB — URINALYSIS, MICROSCOPIC (REFLEX)

## 2021-01-24 LAB — LIPASE, BLOOD: Lipase: 28 U/L (ref 11–51)

## 2021-01-24 MED ORDER — ONDANSETRON 4 MG PO TBDP: 4.0000 mg | ORAL_TABLET | Freq: Three times a day (TID) | ORAL | 0 refills | Status: AC | PRN

## 2021-01-24 MED ORDER — HYDROMORPHONE HCL 1 MG/ML IJ SOLN
0.5000 mg | Freq: Once | INTRAMUSCULAR | Status: AC
Start: 2021-01-24 — End: 2021-01-24
  Administered 2021-01-24: 0.5 mg via INTRAVENOUS
  Filled 2021-01-24: qty 1

## 2021-01-24 MED ORDER — KETOROLAC TROMETHAMINE 15 MG/ML IJ SOLN
15.0000 mg | Freq: Once | INTRAMUSCULAR | Status: AC
  Administered 2021-01-24: 15 mg via INTRAVENOUS
  Filled 2021-01-24: qty 1

## 2021-01-24 NOTE — ED Provider Notes (Signed)
[facility] EMERGENCY DEPARTMENT Provider Note   CSN: [id number] Arrival date & time: 01/24/21  2032     History Chief Complaint  Patient presents with  . Abdominal Pain    Sharon Lamb is a 60 y.o. female.  60yo F w/ PMH including HLD, GERD, fibromyalgia, kidney stones who p/w R flank pain.  Patient suddenly began having stabbing, severe right flank pain this evening just prior to arrival.  Pain wraps around to her right side.  She denies any associated nausea, vomiting, or diarrhea.  She has chronic's with constipation.  No urinary symptoms or fevers.  No chest pain or shortness of breath.  She has history of kidney stones requiring lithotripsy in the past but no recent stones.  No medications prior to arrival.  The history is provided by the patient.  Abdominal Pain      Past Medical History:  Diagnosis Date  . Arthritis   . Fibromyalgia   . GERD (gastroesophageal reflux disease)   . History of degenerative disc disease   . Hypercholesteremia   . Kidney stone   . Migraine     Patient Active Problem List   Diagnosis Date Noted  . Breast lump 01/15/2015  . Achilles tendon sprain 01/15/2015  . Breast pain 01/15/2015  . Decreased potassium in the blood 01/12/2015  . Right ankle injury 11/21/2014  . Hypertensive pulmonary vascular disease (HCC) 09/12/2014  . NASH (nonalcoholic steatohepatitis) 03/31/2014  . Calculus of kidney 03/31/2014  . Hemorrhoids, internal 02/08/2014  . DDD (degenerative disc disease), cervical 01/03/2014  . Acid reflux 01/03/2014  . Hypercholesteremia 11/30/2013  . Extreme obesity 11/30/2013  . Cardiac enlargement 07/22/2013  . 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 Allergic rhinitis 03/19/2013  . Chronic nonalcoholic liver disease 03/19/2013  . Generalized OA 03/19/2013  . Headache, migraine 03/19/2013  . Obstructive apnea 03/19/2013  . 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 ABDOMINAL HYSTERECTOMY    . ACHILLES TENDON REPAIR Right 2016  . BREAST CYST EXCISION    . LAPAROSCOPIC GASTRIC SLEEVE RESECTION    . right wrist surgery    . ROTATOR CUFF REPAIR Right 2018  . TUBAL LIGATION       OB History   No obstetric history on file.     No family history on file.  Social History   Tobacco Use  . Smoking status: Never Smoker  . Smokeless tobacco: Never Used  Vaping Use  . Vaping Use: Never used  Substance Use Topics  . Alcohol use: No  . Drug use: No    Home Medications Prior to Admission medications   Medication Sig Start Date End Date Taking? Authorizing Provider  ondansetron (ZOFRAN ODT) 4 MG disintegrating tablet Take 1 tablet (4 mg total) by mouth every 8 (eight) hours as needed for nausea or vomiting. 01/24/21  Yes Atiyeh, Ambrose Finland, MD  celecoxib (CELEBREX) 200 MG capsule  12/28/14   [provider]  Diclofenac Sodium CR 100 MG 24 hr tablet Take 1 tablet (100 mg total) by mouth daily. 06/28/19   Palumbo, April, MD  DULoxetine (CYMBALTA) 20 MG capsule Take 20 mg by mouth daily.    [provider]  esomeprazole (NEXIUM) 40 MG capsule TAKE ONE CAPSULE BY MOUTH TWICE DAILY 30 MINUTES BEFORE BREAKFAST AND DINNER 01/01/15   [provider]  estradiol (ESTRACE) 2 MG tablet TAKE 1 TABLET BY MOUTH DAILY  FOR MENOPAUSE** STOP THE 1 MG TABLET** 12/04/14 01/30/18  [provider]  fluticasone (FLONASE) 50 MCG/ACT nasal spray Place 2 sprays into both nostrils daily. 11/09/14   Ward, Layla Maw, DO  ibuprofen (ADVIL,MOTRIN) 800 MG tablet Take 1 tablet (800 mg total) by mouth every 8 (eight) hours as needed. 10/29/15   Trixie Dredge, PA-C  montelukast (SINGULAIR) 10 MG tablet Take 10 mg by mouth at bedtime.    [provider]  oxyCODONE-acetaminophen (PERCOCET) 5-325 MG tablet Take 1 tablet by mouth every 6 (six) hours as needed. 06/24/19   Dartha Lodge, PA-C  potassium  chloride SA (K-DUR,KLOR-CON) 20 MEQ tablet Take 1 tablet (20 mEq total) by mouth daily. 01/30/18   Cathren Laine, MD  simvastatin (ZOCOR) 40 MG tablet  12/28/14   [provider]  tamsulosin (FLOMAX) 0.4 MG CAPS capsule Take 1 capsule (0.4 mg total) by mouth daily. 06/24/19   Dartha Lodge, PA-C  tiZANidine (ZANAFLEX) 2 MG tablet Take 2 mg by mouth 2 (two) times daily.    [provider]  traZODone (DESYREL) 100 MG tablet Take 100 mg by mouth at bedtime.    [provider]  Vitamin D, Ergocalciferol, (DRISDOL) 50000 units CAPS capsule Take 50,000 Units by mouth every 7 (seven) days.    [provider]    Allergies    Tetracyclines & related and Fentanyl  Review of Systems   Review of Systems  Gastrointestinal: Positive for abdominal pain.   All other systems reviewed and are negative except that which was mentioned in HPI  Physical Exam Updated Vital Signs BP 138/73   Pulse (!) 43   Temp 98.1 F (36.7 C) (Oral)   Resp 15   Ht 5\' 5"  (1.651 m)   Wt 106.2 kg   SpO2 100%   BMI 38.96 kg/m   Physical Exam Vitals and nursing note reviewed.  Constitutional:      General: She is not in acute distress.    Appearance: Normal appearance.     Comments: Uncomfortable, tearful  HENT:     Head: Normocephalic and atraumatic.  Eyes:     Conjunctiva/sclera: Conjunctivae normal.  Cardiovascular:     Rate and Rhythm: Normal rate and regular rhythm.     Heart sounds: Normal heart sounds. No murmur heard.   Pulmonary:     Effort: Pulmonary effort is normal.     Breath sounds: Normal breath sounds.  Abdominal:     General: Abdomen is flat. Bowel sounds are normal. There is no distension.     Palpations: Abdomen is soft.     Tenderness: There is abdominal tenderness in the right lower quadrant and suprapubic area.  Musculoskeletal:     Right lower leg: No edema.     Left lower leg: No edema.  Skin:    General: Skin is warm and dry.  Neurological:      Mental Status: She is alert and oriented to person, place, and time.     Comments: fluent  Psychiatric:        Mood and Affect: Mood normal.        Behavior: Behavior normal.     ED Results / Procedures / Treatments   Labs (all labs ordered are listed, but only abnormal results are displayed) Labs Reviewed  COMPREHENSIVE METABOLIC PANEL - Abnormal; Notable for the following components:      Result Value   Calcium 8.7 (*)    All other components within normal limits  CBC - Abnormal; Notable for the following components:   Hemoglobin 11.7 (*)    All other components within normal limits  URINALYSIS, ROUTINE W REFLEX MICROSCOPIC - Abnormal; Notable for the following components:   APPearance CLOUDY (*)    Hgb urine dipstick LARGE (*)    All other components within normal limits  URINALYSIS, MICROSCOPIC (REFLEX) - Abnormal; Notable for the following components:   Bacteria, UA RARE (*)    All other components within normal limits  LIPASE, BLOOD    EKG EKG Interpretation  Date/Time:  Thursday January 24 2021 20:50:13 EDT Ventricular Rate:  46 PR Interval:  176 QRS Duration: 90 QT Interval:  476 QTC Calculation: 416 R Axis:   63 Text Interpretation: Sinus bradycardia Otherwise normal ECG bradycardia similar to previous Confirmed by Frederick Peers [phone]) on 01/24/2021 9:08:13 PM   Radiology No results found.  Procedures Procedures   Medications Ordered in ED Medications  HYDROmorphone (DILAUDID) injection 0.5 mg (0.5 mg Intravenous Given 01/24/21 2210)  ketorolac (TORADOL) 15 MG/ML injection 15 mg (15 mg Intravenous Given 01/24/21 2213)    ED Course  I have reviewed the triage vital signs and the nursing notes.  Pertinent labs & imaging results that were available during my care of the patient were reviewed by me and considered in my medical decision making (see chart for details).    MDM Rules/Calculators/A&P                          Uncomfortable on exam, VS  stable.  She was bradycardic but states that this is her baseline.  UA with hematuria, no evidence of infection.  Lab work shows normal creatinine, normal WBC count, stable anemia.  CT of abdomen pelvis shows 2 mm stone in mid right ureter with no signs of obstruction.  Given the hematuria and sudden nature of her symptoms, I suspect this is likely the source of her pain. On reassessment, patient stated that her pain was improved, now 5/10 in intensity. She is on chronic opiates thus will use this medication at home for pain control. Provided w/ zofran and urology f/u info.  I have extensively reviewed return precautions including intractable pain, intractable vomiting, or fever.  She voiced understanding. Final Clinical Impression(s) / ED Diagnoses Final diagnoses:  Kidney stone    Rx / DC Orders ED Discharge Orders         Ordered    ondansetron (ZOFRAN ODT) 4 MG disintegrating tablet  Every 8 hours PRN        01/24/21 2348           Redcay, Ambrose Finland, MD 01/25/21 0005

## 2021-01-24 NOTE — ED Triage Notes (Addendum)
Stabbing right flank pain for an hour. She is ambulatory. Heart rate 48 noted. Pt states this is normal for her. EKG ordered.

## 2021-01-25 MED ORDER — OXYCODONE-ACETAMINOPHEN 5-325 MG PO TABS
1.0000 | ORAL_TABLET | Freq: Once | ORAL | Status: AC
  Administered 2021-01-25: 1 via ORAL
  Filled 2021-01-25: qty 1

## 2021-12-29 IMAGING — CT CT RENAL STONE PROTOCOL
2 of 4 series · 16 of 46 positions shown, 18 images · non-contrast
Comparison: CT 06/24/2019

CLINICAL DATA: Right flank pain, stone disease suspected

EXAM:
CT ABDOMEN AND PELVIS WITHOUT CONTRAST
TECHNIQUE: Multidetector CT imaging of the abdomen and pelvis was performed
following the standard protocol without IV contrast.

[Series 5: coronal st · coronal · 0.89mm/px · 3 of 87 slices shown]
[im 29/87  soft-tissue]
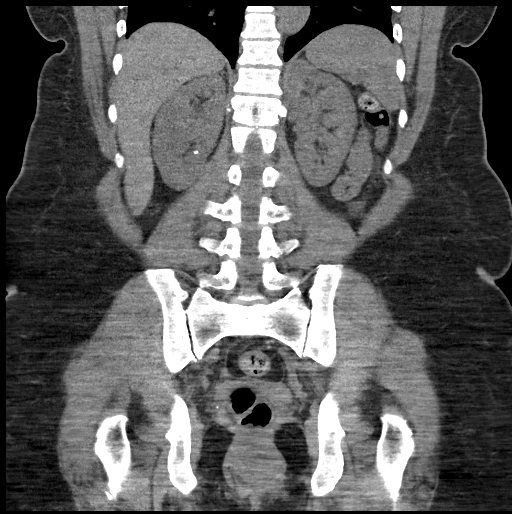
[im 39/87  soft-tissue]
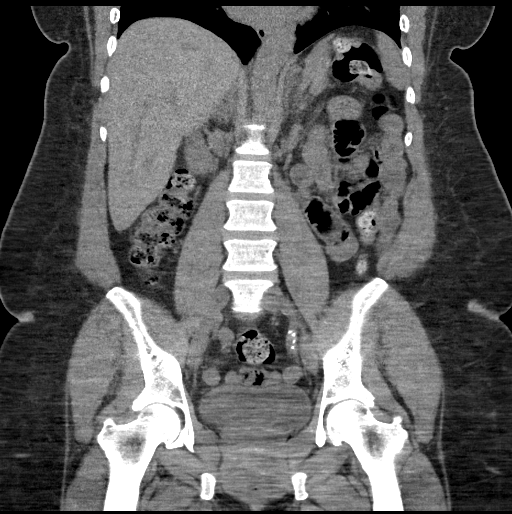
[im 48/87  soft-tissue]
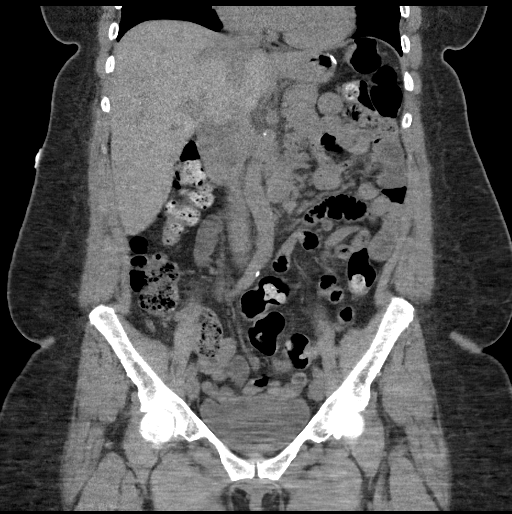

[Series 7: axial st · axial · 0.90mm/px · z∈[-443,-48]mm · 13 of 90 slices shown, 15 images]
[im 7/90  soft-tissue]
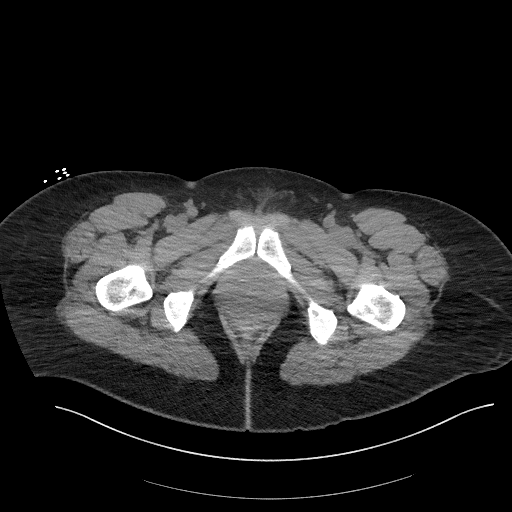
[im 7/90  bone]
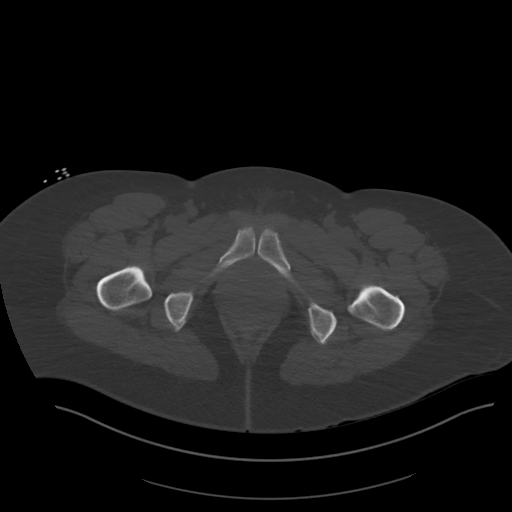
[im 14/90  soft-tissue]
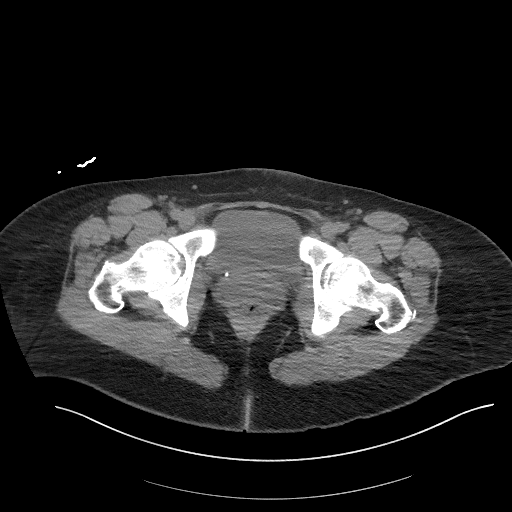
[im 20/90  soft-tissue]
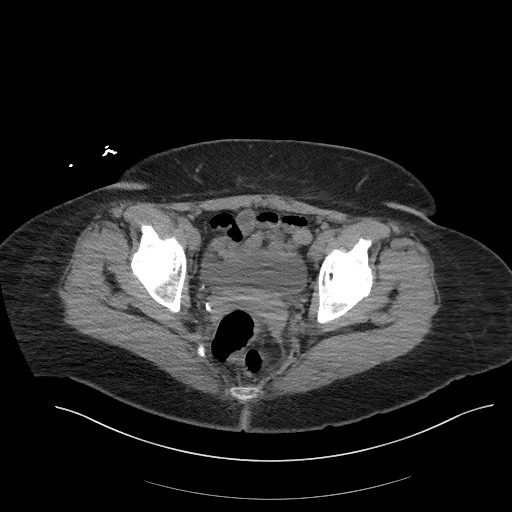
[im 27/90  soft-tissue]
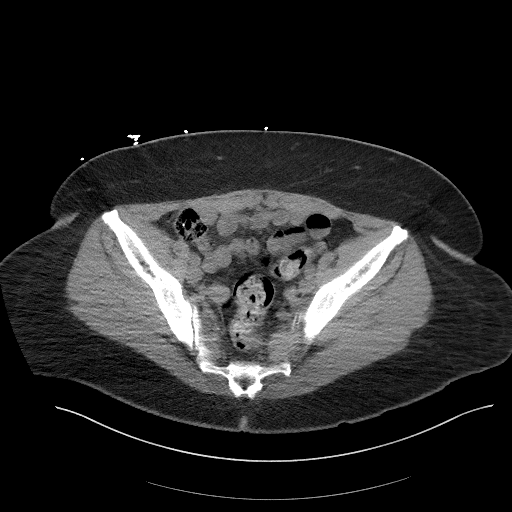
[im 33/90  soft-tissue]
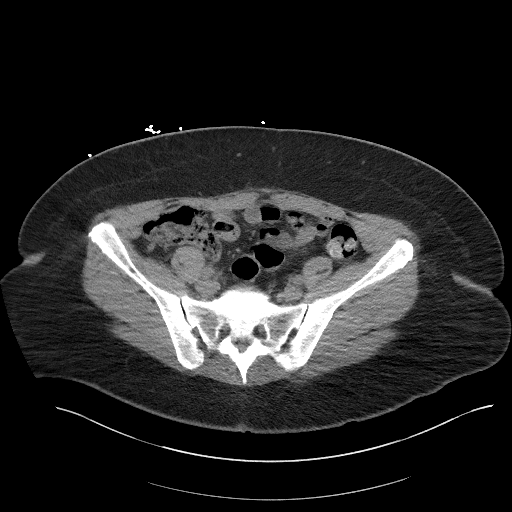
[im 40/90  soft-tissue]
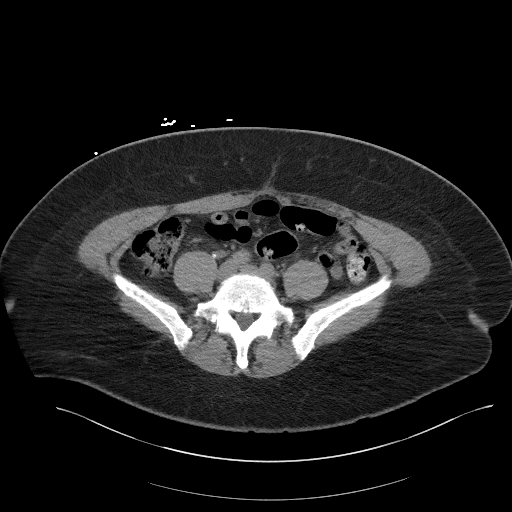
[im 47/90  soft-tissue]
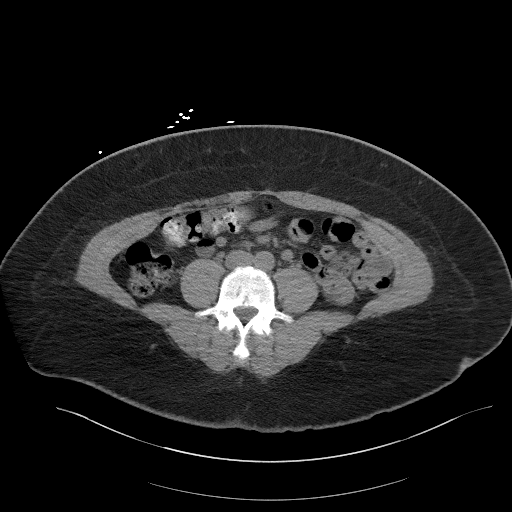
[im 53/90  soft-tissue]
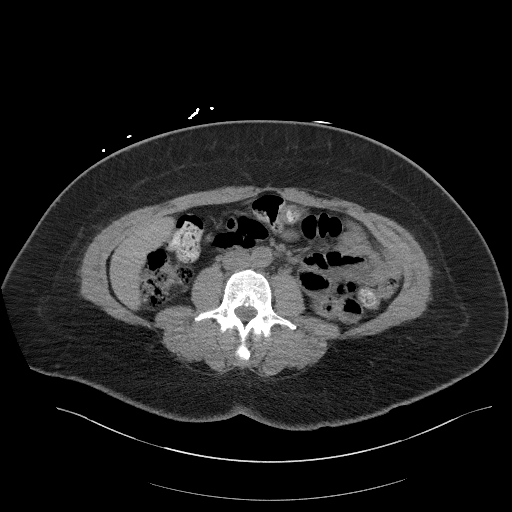
[im 60/90  soft-tissue]
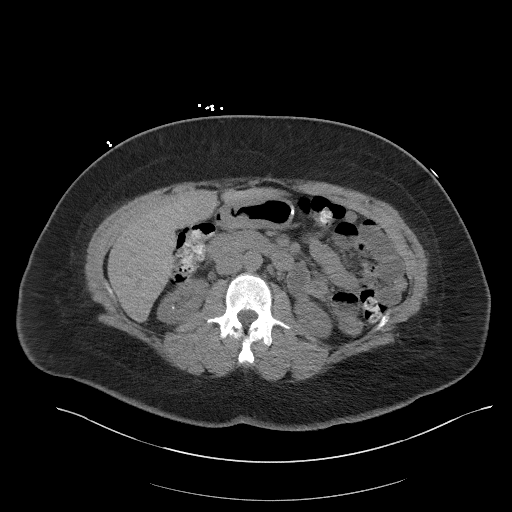
[im 60/90  bone]
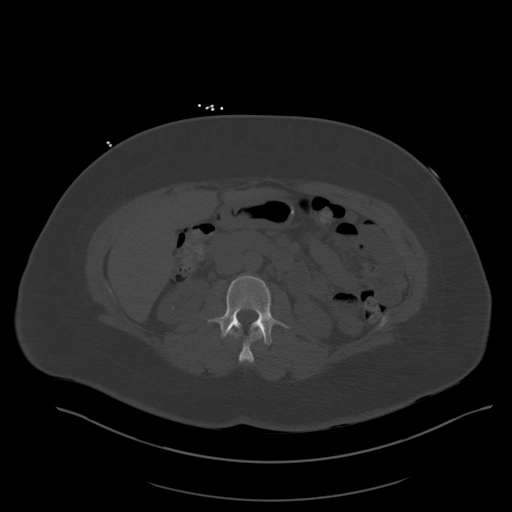
[im 66/90  soft-tissue]
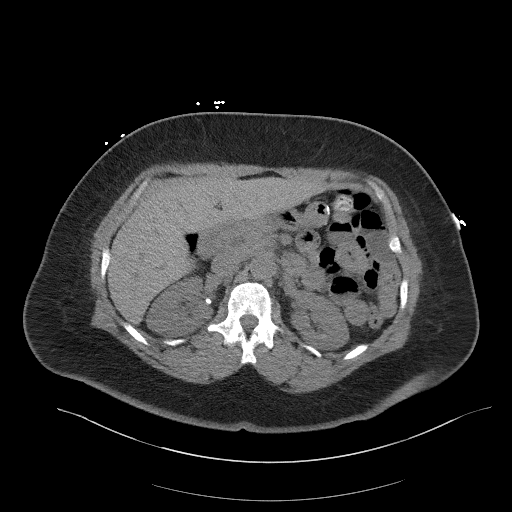
[im 73/90  soft-tissue]
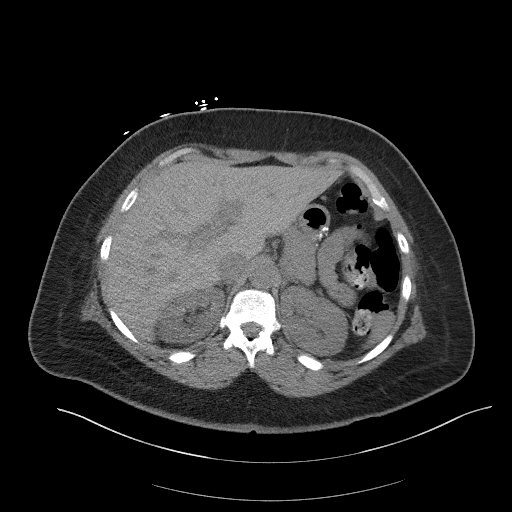
[im 80/90  soft-tissue]
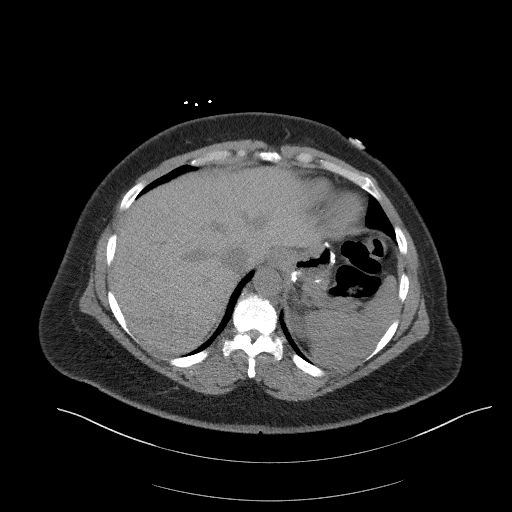
[im 86/90  soft-tissue]
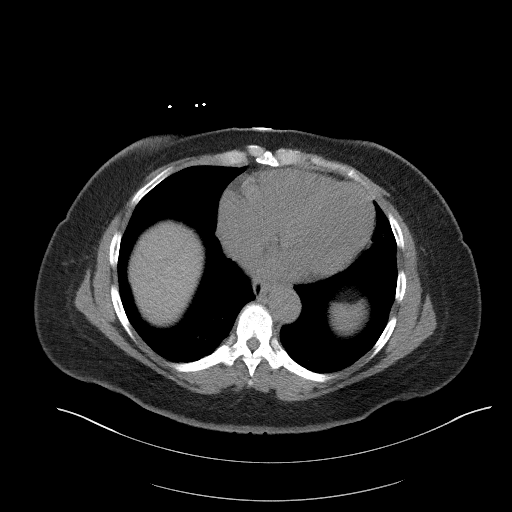

[16 of 46 positions shown; findings below may reference images not displayed]

FINDINGS: Lower chest: Basilar atelectasis. Cardiac size top normal. Coronary
artery calcifications. No pericardial effusion.

Hepatobiliary: No visible focal liver lesion. Smooth liver surface
contour. Normal hepatic attenuation. Prior cholecystectomy. No
significant biliary ductal dilatation or visible intraductal
gallstones.

Pancreas: No pancreatic ductal dilatation or surrounding
inflammatory changes.

Spleen: Normal in size. No concerning splenic lesions.

Adrenals/Urinary Tract: Normal adrenal glands. Bilateral punctate
nephroliths are present, many of which appear nonobstructive though
a slightly more central 6 mm calculus is seen towards the right
ureteropelvic junction (5/33, [DATE]). Additional new calculus is seen
along the course of the mid right ureter (7/51, 5/46). No other new
calculi are seen along the course of the right ureter. No
significant asymmetric urinary tract dilatation is seen. No
concerning focal renal lesion. Urinary bladder is unremarkable.

Stomach/Bowel: Distal esophagus unremarkable. Postsurgical changes
from a prior gastric sleeve resection. Duodenum is unremarkable. No
small bowel thickening or dilatation. Normal appendix in the right
lower quadrant. No colonic dilatation or wall thickening.

Vascular/Lymphatic: Atherosclerotic calcifications within the
abdominal aorta and branch vessels. No aneurysm or ectasia. No
enlarged abdominopelvic lymph nodes.

Reproductive: Uterus appears surgically absent. No concerning
adnexal lesions.

Other: No abdominopelvic free fluid or free gas. No bowel containing
hernias.

Musculoskeletal: No acute osseous abnormality or suspicious osseous
lesion. Multilevel degenerative changes are present in the imaged
portions of the spine. There are mild degenerative changes in the
hips and pelvis as well.
IMPRESSION: 1. Bilateral nephrolithiasis, many of which appear nonobstructive,
though a slightly more central 6 mm calculus is seen towards the
right ureteropelvic junction as well as a 2 mm calcification along
the course of the mid right ureter however no significant asymmetric
urinary tract dilatation is seen though some partial obstruction or
passage of a calculus could result in patient's symptoms.
2. Postsurgical changes from a prior gastric sleeve resection,
cholecystectomy, hysterectomy.
3. Aortic Atherosclerosis ([id number]).

## 2022-04-18 ENCOUNTER — Emergency Department (HOSPITAL_BASED_OUTPATIENT_CLINIC_OR_DEPARTMENT_OTHER): Payer: BC Managed Care – PPO

## 2022-04-18 ENCOUNTER — Emergency Department (HOSPITAL_BASED_OUTPATIENT_CLINIC_OR_DEPARTMENT_OTHER)
Admission: EM | Admit: 2022-04-18 | Discharge: 2022-04-18 | Disposition: A | Discharge status: Home / Self Care | Payer: BC Managed Care – PPO | Attending: Emergency Medicine | Admitting: Emergency Medicine

## 2022-04-18 ENCOUNTER — Other Ambulatory Visit: Payer: Self-pay

## 2022-04-18 DIAGNOSIS — E876 Hypokalemia: Secondary | ICD-10-CM | POA: Insufficient documentation

## 2022-04-18 DIAGNOSIS — R001 Bradycardia, unspecified: Secondary | ICD-10-CM | POA: Diagnosis not present

## 2022-04-18 DIAGNOSIS — R791 Abnormal coagulation profile: Secondary | ICD-10-CM | POA: Diagnosis not present

## 2022-04-18 DIAGNOSIS — R0789 Other chest pain: Secondary | ICD-10-CM | POA: Insufficient documentation

## 2022-04-18 LAB — BASIC METABOLIC PANEL
Anion gap: 8 (ref 5–15)
BUN: 10 mg/dL (ref 6–20)
CO2: 26 mmol/L (ref 22–32)
Calcium: 9.3 mg/dL (ref 8.9–10.3)
Chloride: 106 mmol/L (ref 98–111)
Creatinine, Ser: 0.89 mg/dL (ref 0.44–1.00)
GFR, Estimated: >60 mL/min (ref >60)
Glucose, Bld: 92 mg/dL (ref 70–99)
Potassium: 3.4 mmol/L — ABNORMAL LOW (ref 3.5–5.1)
Sodium: 140 mmol/L (ref 135–145)

## 2022-04-18 LAB — CBC
HCT: 40.3 % (ref 36.0–46.0)
Hemoglobin: 12.8 g/dL (ref 12.0–15.0)
MCH: 26.9 pg (ref 26.0–34.0)
MCHC: 31.8 g/dL (ref 30.0–36.0)
MCV: 84.7 fL (ref 80.0–100.0)
Platelets: 238 10*3/uL (ref 150–400)
RBC: 4.76 MIL/uL (ref 3.87–5.11)
RDW: 12.5 % (ref 11.5–15.5)
WBC: 4.8 10*3/uL (ref 4.0–10.5)
nRBC: 0 % (ref 0.0–0.2)

## 2022-04-18 LAB — TROPONIN I (HIGH SENSITIVITY)
Troponin I (High Sensitivity): 5 ng/L (ref <18)
Troponin I (High Sensitivity): 5 ng/L (ref <18)

## 2022-04-18 LAB — D-DIMER, QUANTITATIVE: D-Dimer, Quant: 0.72 ug/mL-FEU — ABNORMAL HIGH (ref 0.00–0.50)

## 2022-04-18 MED ORDER — NITROGLYCERIN 0.4 MG SL SUBL
0.4000 mg | SUBLINGUAL_TABLET | Freq: Once | SUBLINGUAL | Status: AC
  Administered 2022-04-18: 0.4 mg via SUBLINGUAL
  Filled 2022-04-18: qty 1

## 2022-04-18 MED ORDER — IOHEXOL 350 MG/ML SOLN
100.0000 mL | Freq: Once | INTRAVENOUS | Status: AC | PRN
  Administered 2022-04-18: 100 mL via INTRAVENOUS

## 2022-04-18 MED ORDER — MORPHINE SULFATE (PF) 2 MG/ML IV SOLN
2.0000 mg | Freq: Once | INTRAVENOUS | Status: AC
  Administered 2022-04-18: 2 mg via INTRAVENOUS
  Filled 2022-04-18: qty 1

## 2022-04-18 MED ORDER — ASPIRIN 81 MG PO CHEW
324.0000 mg | CHEWABLE_TABLET | Freq: Once | ORAL | Status: AC
  Administered 2022-04-18: 324 mg via ORAL
  Filled 2022-04-18: qty 4

## 2022-04-18 MED ORDER — ALUM & MAG HYDROXIDE-SIMETH 200-200-20 MG/5ML PO SUSP
30.0000 mL | Freq: Once | ORAL | Status: AC
  Administered 2022-04-18: 30 mL via ORAL
  Filled 2022-04-18: qty 30

## 2022-04-18 MED ORDER — ONDANSETRON HCL 4 MG/2ML IJ SOLN
4.0000 mg | Freq: Once | INTRAMUSCULAR | Status: AC
  Administered 2022-04-18: 4 mg via INTRAVENOUS
  Filled 2022-04-18: qty 2

## 2022-04-18 NOTE — ED Triage Notes (Signed)
States chest pain & shortness of breath on exertion since yesterday.

## 2022-04-18 NOTE — Discharge Instructions (Addendum)
Lab work imaging were all reassuring.  Please continue with your acid pill, you may also start taking Pepcid as needed.  I do recommend due to your age and risk factors to follow-up with a cardiologist, I have given you a referral, you may also follow-up with your PCP.  Come back to the emergency department if you develop chest pain, shortness of breath, severe abdominal pain, uncontrolled nausea, vomiting, diarrhea.

## 2022-04-18 NOTE — ED Notes (Signed)
Discharge instructions reviewed with patient. Patient verbalizes understanding, no further questions at this time. Medications/prescriptions and follow up information provided. No acute distress noted at time of departure.  

## 2022-04-18 NOTE — ED Provider Notes (Signed)
[facility] EMERGENCY DEPARTMENT Provider Note   CSN: [id number] Arrival date & time: 04/18/22  0930     History  Chief Complaint  Patient presents with   Chest Pain    Sharon Lamb is a 61 y.o. female.  HPI  Medical history including obesity, hyperlipidemia, CVA with right-sided deficits presents with complaints of chest pain chest pain started yesterday, came on while she was walking to the kitchen, pain remains in the middle of her chest, states pain will come and go, does not radiate, does not become diaphoretic lightheaded or dizziness, she does endorse that she has worsening shortness of breath, states that she feels cannot get a hold breath in, she states that pain is worsened with exertion, improved with rest, she states she will ambulate have to sit down to 50 minutes and will go away.  She has no associated leg swelling no recent surgeries, no associated long immobilizations, no history of PEs or DVTs current on hormone therapy, she has no family history of heart abnormalities, does not smoke, denies illicit drug use.  Has never had this in the past.    Home Medications Prior to Admission medications   Medication Sig Start Date End Date Taking? Authorizing Provider  celecoxib (CELEBREX) 200 MG capsule  12/28/14   [provider]  Diclofenac Sodium CR 100 MG 24 hr tablet Take 1 tablet (100 mg total) by mouth daily. 06/28/19   Palumbo, April, MD  DULoxetine (CYMBALTA) 20 MG capsule Take 20 mg by mouth daily.    [provider]  esomeprazole (NEXIUM) 40 MG capsule TAKE ONE CAPSULE BY MOUTH TWICE DAILY 30 MINUTES BEFORE BREAKFAST AND DINNER 01/01/15   [provider]  estradiol (ESTRACE) 2 MG tablet TAKE 1 TABLET BY MOUTH DAILY FOR MENOPAUSE** STOP THE 1 MG TABLET** 12/04/14 01/30/18  [provider]  fluticasone (FLONASE) 50 MCG/ACT nasal spray Place 2 sprays into both nostrils daily. 11/09/14   Ward, Layla Maw, DO  ibuprofen  (ADVIL,MOTRIN) 800 MG tablet Take 1 tablet (800 mg total) by mouth every 8 (eight) hours as needed. 10/29/15   Trixie Dredge, PA-C  montelukast (SINGULAIR) 10 MG tablet Take 10 mg by mouth at bedtime.    [provider]  ondansetron (ZOFRAN ODT) 4 MG disintegrating tablet Take 1 tablet (4 mg total) by mouth every 8 (eight) hours as needed for nausea or vomiting. 01/24/21   Greaves, Ambrose Finland, MD  oxyCODONE-acetaminophen (PERCOCET) 5-325 MG tablet Take 1 tablet by mouth every 6 (six) hours as needed. 06/24/19   Dartha Lodge, PA-C  potassium chloride SA (K-DUR,KLOR-CON) 20 MEQ tablet Take 1 tablet (20 mEq total) by mouth daily. 01/30/18   Cathren Laine, MD  simvastatin (ZOCOR) 40 MG tablet  12/28/14   [provider]  tamsulosin (FLOMAX) 0.4 MG CAPS capsule Take 1 capsule (0.4 mg total) by mouth daily. 06/24/19   Dartha Lodge, PA-C  tiZANidine (ZANAFLEX) 2 MG tablet Take 2 mg by mouth 2 (two) times daily.    [provider]  traZODone (DESYREL) 100 MG tablet Take 100 mg by mouth at bedtime.    [provider]  Vitamin D, Ergocalciferol, (DRISDOL) 50000 units CAPS capsule Take 50,000 Units by mouth every 7 (seven) days.    [provider]      Allergies    Tetracyclines & related and Fentanyl    Review of Systems   Review of Systems  Constitutional:  Negative for chills and fever.  Respiratory:  Positive for shortness of breath.   Cardiovascular:  Positive for chest pain.  Gastrointestinal:  Negative for abdominal pain.  Neurological:  Negative for headaches.    Physical Exam Updated Vital Signs BP (!) 167/87   Pulse 66   Temp 98.9 F (37.2 C) (Oral)   Resp 20   Ht 5\' 6"  (1.676 m)   Wt 103 kg   SpO2 100%   BMI 36.64 kg/m  Physical Exam Vitals and nursing note reviewed.  Constitutional:      General: She is not in acute distress.    Appearance: She is not ill-appearing.  HENT:     Head: Normocephalic and atraumatic.     Nose: No  congestion.  Eyes:     Conjunctiva/sclera: Conjunctivae normal.  Cardiovascular:     Rate and Rhythm: Normal rate and regular rhythm.     Pulses: Normal pulses.     Heart sounds: No murmur heard.    No friction rub. No gallop.  Pulmonary:     Effort: No respiratory distress.     Breath sounds: No wheezing, rhonchi or rales.  Abdominal:     Palpations: Abdomen is soft.     Tenderness: There is no abdominal tenderness. There is no right CVA tenderness or left CVA tenderness.  Musculoskeletal:     Right lower leg: No edema.     Left lower leg: No edema.  Skin:    General: Skin is warm and dry.  Neurological:     Mental Status: She is alert.  Psychiatric:        Mood and Affect: Mood normal.     ED Results / Procedures / Treatments   Labs (all labs ordered are listed, but only abnormal results are displayed) Labs Reviewed  BASIC METABOLIC PANEL - Abnormal; Notable for the following components:      Result Value   Potassium 3.4 (*)    All other components within normal limits  D-DIMER, QUANTITATIVE - Abnormal; Notable for the following components:   D-Dimer, Quant 0.72 (*)    All other components within normal limits  CBC  TROPONIN I (HIGH SENSITIVITY)  TROPONIN I (HIGH SENSITIVITY)    EKG EKG Interpretation  Date/Time:  Friday April 18 2022 09:43:26 EDT Ventricular Rate:  48 PR Interval:  192 QRS Duration: 86 QT Interval:  488 QTC Calculation: 435 R Axis:   38 Text Interpretation: Sinus bradycardia with sinus arrhythmia Nonspecific T wave abnormality Abnormal ECG When compared with ECG of 24-Jan-2021 22:59, PREVIOUS ECG IS PRESENT Confirmed by 26-Jan-2021 ([id number]) on 04/18/2022 9:46:56 AM  Radiology CT Angio Chest PE W and/or Wo Contrast  Result Date: 04/18/2022 CLINICAL DATA:  Chest pain with shortness of breath on exertion since yesterday. Pulmonary embolism suspected, high probability. EXAM: CT ANGIOGRAPHY CHEST WITH CONTRAST TECHNIQUE: Multidetector CT imaging of  the chest was performed using the standard protocol during bolus administration of intravenous contrast. Multiplanar CT image reconstructions and MIPs were obtained to evaluate the vascular anatomy. RADIATION DOSE REDUCTION: This exam was performed according to the departmental dose-optimization program which includes automated exposure control, adjustment of the mA and/or kV according to patient size and/or use of iterative reconstruction technique. CONTRAST:  06/19/2022 OMNIPAQUE IOHEXOL 350 MG/ML SOLN COMPARISON:  Chest CTA 12/15/2019. FINDINGS: Cardiovascular: The pulmonary arteries are well opacified with contrast to the level of the subsegmental branches. There is no evidence of acute pulmonary embolism. Mild aortic and coronary artery atherosclerosis without evidence of acute systemic arterial abnormality. The heart  size is normal. There is no pericardial effusion. Mediastinum/Nodes: There are no enlarged mediastinal, hilar or axillary lymph nodes. The thyroid gland, trachea and esophagus demonstrate no significant findings. Lungs/Pleura: No pleural effusion or pneumothorax. The lungs are clear. Upper abdomen: The visualized upper abdomen appears stable, without acute findings. There are postsurgical changes from bariatric surgery. Possible tiny right renal calculi. Musculoskeletal/Chest wall: There is no chest wall mass or suspicious osseous finding. Mild thoracic spondylosis. Review of the MIP images confirms the above findings. IMPRESSION: 1. No evidence of acute pulmonary embolism or other acute chest process. 2. Coronary and Aortic Atherosclerosis (ICD10-I70.0). Electronically Signed   By: Carey Bullocks M.D.   On: 04/18/2022 13:16   DG Chest 2 View  Result Date: 04/18/2022 CLINICAL DATA:  Chest pain and dizziness EXAM: CHEST - 2 VIEW COMPARISON:  12/15/2019 CT.  01/30/2018 chest radiograph. FINDINGS: Cholecystectomy clips. Midline trachea.  Normal heart size and mediastinal contours. Sharp costophrenic  angles. No pneumothorax. EKG lead artifacts projecting over the upper lobes. IMPRESSION: No active cardiopulmonary disease. Electronically Signed   By: Jeronimo Greaves M.D.   On: 04/18/2022 09:57    Procedures Procedures    Medications Ordered in ED Medications  aspirin chewable tablet 324 mg (324 mg Oral Given 04/18/22 1144)  alum & mag hydroxide-simeth (MAALOX/MYLANTA) 200-200-20 MG/5ML suspension 30 mL (30 mLs Oral Given 04/18/22 1144)  morphine (PF) 2 MG/ML injection 2 mg (2 mg Intravenous Given 04/18/22 1234)  nitroGLYCERIN (NITROSTAT) SL tablet 0.4 mg (0.4 mg Sublingual Given 04/18/22 1233)  ondansetron (ZOFRAN) injection 4 mg (4 mg Intravenous Given 04/18/22 1233)  iohexol (OMNIPAQUE) 350 MG/ML injection 100 mL (100 mLs Intravenous Contrast Given 04/18/22 1252)    ED Course/ Medical Decision Making/ A&P                           Medical Decision Making Amount and/or Complexity of Data Reviewed Labs: ordered. Radiology: ordered.  Risk OTC drugs. Prescription drug management.   This patient presents to the ED for concern of chest pain, this involves an extensive number of treatment options, and is a complaint that carries with it a high risk of complications and morbidity.  The differential diagnosis includes ACS, unstable angina, PE, GERD    Additional history obtained:  Additional history obtained from husband at bedside External records from outside source obtained and reviewed including previous cardiology notes   Co morbidities that complicate the patient evaluation  Obesity, hyperlipidemia  Social Determinants of Health:  N/A    Lab Tests:  I Ordered, and personally interpreted labs.  The pertinent results include: CBC unremarkable, CMP shows potassium 3.4, first negative delta troponin, D-dimer is elevated 0.  7 2   Imaging Studies ordered:  I ordered imaging studies including chest x-ray I independently visualized and interpreted imaging which showed  unremarkable, CTA is negative. I agree with the radiologist interpretation   Cardiac Monitoring:  The patient was maintained on a cardiac monitor.  I personally viewed and interpreted the cardiac monitored which showed an underlying rhythm of: Sinus bradycardia without signs of ischemia   Medicines ordered and prescription drug management:  I ordered medication including aspirin, GI cocktail I have reviewed the patients home medicines and have made adjustments as needed  Critical Interventions:  N/A   Reevaluation:  Presents with chest pain, triage obtain lab work and imaging which I personally reviewed, unremarkable, patient endorsing difficulty breathing, she was initially tachypneic will add on  D-dimer, also add on GI cocktail and reassess.  Reassessed the patient, states that she still having chest pain and was unable to drink the GI cocktail she felt nauseous. will provide with morphine as well as nitro and reassess.  Elevated D-dimer, cannot rule out with age adjustment, will proceed with CTA of chest.   Patient is reassessed, resting calmly, states she is feeling much better, has no more chest pain, she is agreement with plan discharge at this time.  Consultations Obtained:  N/A    Considered:  Admission-we will defer as patient has a heart score of 3, she is chest pain-free at this time, without EKG changes as well as negative delta troponins feel patient is stable for outpatient follow-up    Rule out I have low suspicion for ACS as history is atypical, patient has no cardiac history, EKG was sinus rhythm without signs of ischemia, patient had negative delta troponin.  Low suspicion for PE CTA negative.  low suspicion for AAA or aortic dissection as history is atypical, patient has low risk factors.  Low suspicion for systemic infection as patient is nontoxic-appearing, vital signs reassuring, no obvious source infection noted on exam.     Dispostion and problem  list  After consideration of the diagnostic results and the patients response to treatment, I feel that the patent would benefit from discharge.  Chest pain-unclear etiology but I suspect may be more related to acid reflux, currently on 80 mg of omeprazole, will have her add on Pepcid for further management.  Due to patient's age and risk factors including obesity and hyperlipidemia we will have her follow-up with a cardiologist for further evaluation.            Final Clinical Impression(s) / ED Diagnoses Final diagnoses:  Atypical chest pain    Rx / DC Orders ED Discharge Orders          Ordered    Ambulatory referral to Cardiology       Comments: If you have not heard from the Cardiology office within the next 72 hours please call [phone].   04/18/22 1344              Carroll Sage, PA-C 04/18/22 1346    Edwin Dada P, DO 04/25/22 [phone]

## 2022-04-18 NOTE — ED Notes (Signed)
States bradycardia at baseline

## 2022-04-18 NOTE — ED Notes (Signed)
Patient transported to X-ray 

## 2022-10-23 ENCOUNTER — Emergency Department (HOSPITAL_COMMUNITY)
Admission: EM | Admit: 2022-10-23 | Discharge: 2022-10-23 | Disposition: A | Discharge status: Home / Self Care | Payer: BC Managed Care – PPO | Attending: Emergency Medicine | Admitting: Emergency Medicine

## 2022-10-23 ENCOUNTER — Emergency Department (HOSPITAL_COMMUNITY): Payer: BC Managed Care – PPO

## 2022-10-23 ENCOUNTER — Encounter (HOSPITAL_COMMUNITY): Payer: Self-pay

## 2022-10-23 ENCOUNTER — Other Ambulatory Visit: Payer: Self-pay

## 2022-10-23 DIAGNOSIS — R251 Tremor, unspecified: Secondary | ICD-10-CM | POA: Insufficient documentation

## 2022-10-23 DIAGNOSIS — R569 Unspecified convulsions: Secondary | ICD-10-CM | POA: Diagnosis present

## 2022-10-23 LAB — CBC WITH DIFFERENTIAL/PLATELET
Abs Immature Granulocytes: 0 10*3/uL (ref 0.00–0.07)
Basophils Absolute: 0 10*3/uL (ref 0.0–0.1)
Basophils Relative: 1 %
Eosinophils Absolute: 0 10*3/uL (ref 0.0–0.5)
Eosinophils Relative: 1 %
HCT: 35.2 % — ABNORMAL LOW (ref 36.0–46.0)
Hemoglobin: 11 g/dL — ABNORMAL LOW (ref 12.0–15.0)
Immature Granulocytes: 0 %
Lymphocytes Relative: 45 %
Lymphs Abs: 2.3 10*3/uL (ref 0.7–4.0)
MCH: 28.1 pg (ref 26.0–34.0)
MCHC: 31.3 g/dL (ref 30.0–36.0)
MCV: 90 fL (ref 80.0–100.0)
Monocytes Absolute: 0.4 10*3/uL (ref 0.1–1.0)
Monocytes Relative: 8 %
Neutro Abs: 2.2 10*3/uL (ref 1.7–7.7)
Neutrophils Relative %: 45 %
Platelets: 289 10*3/uL (ref 150–400)
RBC: 3.91 MIL/uL (ref 3.87–5.11)
RDW: 13.2 % (ref 11.5–15.5)
WBC: 4.9 10*3/uL (ref 4.0–10.5)
nRBC: 0 % (ref 0.0–0.2)

## 2022-10-23 LAB — BASIC METABOLIC PANEL
Anion gap: 8 (ref 5–15)
BUN: 10 mg/dL (ref 6–20)
CO2: 24 mmol/L (ref 22–32)
Calcium: 8 mg/dL — ABNORMAL LOW (ref 8.9–10.3)
Chloride: 107 mmol/L (ref 98–111)
Creatinine, Ser: 0.76 mg/dL (ref 0.44–1.00)
GFR, Estimated: >60 mL/min (ref >60)
Glucose, Bld: 81 mg/dL (ref 70–99)
Potassium: 3.1 mmol/L — ABNORMAL LOW (ref 3.5–5.1)
Sodium: 139 mmol/L (ref 135–145)

## 2022-10-23 LAB — URINALYSIS, ROUTINE W REFLEX MICROSCOPIC
Bilirubin Urine: NEGATIVE
Glucose, UA: NEGATIVE mg/dL
Hgb urine dipstick: NEGATIVE
Ketones, ur: NEGATIVE mg/dL
Leukocytes,Ua: NEGATIVE
Nitrite: NEGATIVE
Protein, ur: NEGATIVE mg/dL
Specific Gravity, Urine: 1.008 (ref 1.005–1.030)
pH: 6 (ref 5.0–8.0)

## 2022-10-23 LAB — ETHANOL: Alcohol, Ethyl (B): <10 mg/dL (ref <10)

## 2022-10-23 LAB — CBG MONITORING, ED: Glucose-Capillary: 88 mg/dL (ref 70–99)

## 2022-10-23 MED ORDER — LORAZEPAM 2 MG/ML IJ SOLN
0.5000 mg | Freq: Once | INTRAMUSCULAR | Status: DC
  Filled 2022-10-23: qty 1

## 2022-10-23 MED ORDER — LORAZEPAM 2 MG/ML IJ SOLN
1.0000 mg | Freq: Once | INTRAMUSCULAR | Status: AC
  Administered 2022-10-23: 1 mg via INTRAVENOUS
  Filled 2022-10-23: qty 1

## 2022-10-23 MED ORDER — POTASSIUM CHLORIDE CRYS ER 20 MEQ PO TBCR
40.0000 meq | EXTENDED_RELEASE_TABLET | Freq: Once | ORAL | Status: AC
  Administered 2022-10-23: 40 meq via ORAL
  Filled 2022-10-23: qty 2

## 2022-10-23 NOTE — ED Triage Notes (Addendum)
Patient coming [place] [facility] via EMS with c/o seizures. Per EMS patient c/o right leg numbness to daughter was helped to the ground and then had a seizure. Per EMS patient was A&Ox4 on arrival. Per EMS patient had second seizure with EMS no postictal state was observed post seizure. Hx seizures, hx stroke. Pt denies any injuries at this time.

## 2022-10-23 NOTE — ED Notes (Signed)
Patient to MRI.

## 2022-10-23 NOTE — ED Provider Notes (Signed)
[facility]-EMERGENCY DEPT Provider Note   CSN: [id number] Arrival date & time: 10/23/22  1217     History  Chief Complaint  Patient presents with   Seizures    Sharon Lamb is a 62 y.o. female.  62 year old female with prior medical history as detailed below presents via EMS.  Patient was at [place] [facility] with her family.  Patient had seizure-like episode there.  Per EMS, patient had another generalized shaking episode during transport.  Patient stopped shaking after being reassured by EMS during transport.  Patient was not postictal.  Patient did not bite her tongue.  Patient was not incontinent.  Patient did not have apnea.  Upon arrival to the ED, the patient is calm.  During exam she became tearful and then began to again shake all over.  This lasted approximately 10 seconds before she was able to stop after being reassured that she was okay.  She did not have a postictal state.  She did not become incontinent.  She did not bite her tongue.  She was not apneic.  She did not require medication to stop shaking.  Patient with documented prior history of seizure.  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  Patient with prior outpatient neurology care with the [facility].  Patient is compliant with previously prescribed gabapentin and Keppra.  Prior seizures typically involve shaking of the right leg versus a more tonic-clonic generalized seizure.  The history is provided by the patient and medical records.       Home Medications Prior to Admission medications   Medication Sig Start Date End Date Taking? Authorizing Provider  celecoxib (CELEBREX) 200 MG capsule  12/28/14   [provider]  Diclofenac Sodium CR 100 MG 24 hr tablet Take 1 tablet (100 mg total) by mouth daily. 06/28/19   Palumbo, April, MD  DULoxetine (CYMBALTA) 20 MG capsule Take 20 mg by mouth daily.    [provider]  esomeprazole (NEXIUM)  40 MG capsule TAKE ONE CAPSULE BY MOUTH TWICE DAILY 30 MINUTES BEFORE BREAKFAST AND DINNER 01/01/15   [provider]  estradiol (ESTRACE) 2 MG tablet TAKE 1 TABLET BY MOUTH DAILY FOR MENOPAUSE** STOP THE 1 MG TABLET** 12/04/14 01/30/18  [provider]  fluticasone (FLONASE) 50 MCG/ACT nasal spray Place 2 sprays into both nostrils daily. 11/09/14   Ward, Layla Maw, DO  ibuprofen (ADVIL,MOTRIN) 800 MG tablet Take 1 tablet (800 mg total) by mouth every 8 (eight) hours as needed. 10/29/15   Trixie Dredge, PA-C  montelukast (SINGULAIR) 10 MG tablet Take 10 mg by mouth at bedtime.    [provider]  ondansetron (ZOFRAN ODT) 4 MG disintegrating tablet Take 1 tablet (4 mg total) by mouth every 8 (eight) hours as needed for nausea or vomiting. 01/24/21   Tu, Ambrose Finland, MD  oxyCODONE-acetaminophen (PERCOCET) 5-325 MG tablet Take 1 tablet by mouth every 6 (six) hours as needed. 06/24/19   Dartha Lodge, PA-C  potassium chloride SA (K-DUR,KLOR-CON) 20 MEQ tablet Take 1 tablet (20 mEq total) by mouth daily. 01/30/18   Cathren Laine, MD  simvastatin (ZOCOR) 40 MG tablet  12/28/14   [provider]  tamsulosin (FLOMAX) 0.4 MG CAPS capsule Take 1 capsule (0.4 mg total) by mouth daily. 06/24/19   Dartha Lodge, PA-C  tiZANidine (ZANAFLEX) 2 MG tablet Take 2 mg by mouth 2 (two) times daily.    [provider]  traZODone (DESYREL) 100 MG  tablet Take 100 mg by mouth at bedtime.    [provider]  Vitamin D, Ergocalciferol, (DRISDOL) 50000 units CAPS capsule Take 50,000 Units by mouth every 7 (seven) days.    [provider]      Allergies    Tetracyclines & related and Fentanyl    Review of Systems   Review of Systems  All other systems reviewed and are negative.   Physical Exam Updated Vital Signs BP 131/80 (BP Location: Left Arm)   Pulse (!) 51   Temp 98 F (36.7 C) (Oral)   Resp 14   Ht 5\' 6"  (1.676 m)   Wt 102.1 kg   SpO2 98%   BMI  36.32 kg/m  Physical Exam Vitals and nursing note reviewed.  Constitutional:      General: She is not in acute distress.    Appearance: Normal appearance. She is well-developed.  HENT:     Head: Normocephalic and atraumatic.  Eyes:     Conjunctiva/sclera: Conjunctivae normal.     Pupils: Pupils are equal, round, and reactive to light.  Cardiovascular:     Rate and Rhythm: Normal rate and regular rhythm.     Heart sounds: Normal heart sounds.  Pulmonary:     Effort: Pulmonary effort is normal. No respiratory distress.     Breath sounds: Normal breath sounds.  Abdominal:     General: There is no distension.     Palpations: Abdomen is soft.     Tenderness: There is no abdominal tenderness.  Musculoskeletal:        General: No deformity. Normal range of motion.     Cervical back: Normal range of motion and neck supple.  Skin:    General: Skin is warm and dry.  Neurological:     General: No focal deficit present.     Mental Status: She is alert and oriented to person, place, and time. Mental status is at baseline.     Comments: Alert, oriented x 4, no focal deficit.  During initial exam and interview patient did have a period of 10 seconds of generalized shaking.  This halted after the patient was reassured that she was okay.  Patient without observed postictal state or incontinence or apnea.  Witnessed activity was not consistent with seizure.     ED Results / Procedures / Treatments   Labs (all labs ordered are listed, but only abnormal results are displayed) Labs Reviewed  URINALYSIS, ROUTINE W REFLEX MICROSCOPIC - Abnormal; Notable for the following components:      Result Value   Color, Urine STRAW (*)    All other components within normal limits  URINE CULTURE  CBC WITH DIFFERENTIAL/PLATELET  BASIC METABOLIC PANEL  ETHANOL  LEVETIRACETAM LEVEL  CBG MONITORING, ED    EKG EKG Interpretation  Date/Time:  Thursday October 23 2022 12:45:42 EST Ventricular Rate:   53 PR Interval:  195 QRS Duration: 96 QT Interval:  489 QTC Calculation: 460 R Axis:   10 Text Interpretation: Sinus rhythm RSR' in V1 or V2, right VCD or RVH Confirmed by Dene Gentry [phone]) on 10/23/2022 12:52:25 PM  Radiology No results found.  Procedures Procedures    Medications Ordered in ED Medications  LORazepam (ATIVAN) injection 1 mg (1 mg Intravenous Given 10/23/22 1303)    ED Course/ Medical Decision Making/ A&P                           Medical Decision Making  Amount and/or Complexity of Data Reviewed Labs: ordered. Radiology: ordered.  Risk Prescription drug management.    Medical Screen Complete  This patient presented to the ED with complaint of seizure-like activity.  This complaint involves an extensive number of treatment options. The initial differential diagnosis includes, but is not limited to, seizure, nonepileptic seizure, metabolic abnormality, CVA, etc.  This presentation is: Acute, Chronic, Self-Limited, Previously Undiagnosed, Uncertain Prognosis, Complicated, Systemic Symptoms, and Threat to Life/Bodily Function  Patient with known history of prior stroke and possible seizure related to same presents with shaking and seizure-like activity.  Observed behavior is not consistent with epileptic seizure.  Screening labs obtained are without significant abnormality.  MRI brain obtained is without evidence of acute pathology.  Patient is reassured by findings.  Patient is comfortable and desires discharge home.  Patient without recurrent episodes of shaking or seizure-like activity during the entirety of ED evaluation other than from when she first presented.  She and her family members present at bedside understand need for close outpatient follow-up with their already established neurologist.  Importance of close follow-up is repeatedly stressed.  Strict return precautions given and understood.  Basic seizure precautions including not  driving or operating heavy machinery is repeatedly stressed.  Additional history obtained: External records from outside sources obtained and reviewed including prior ED visits and prior Inpatient records.    Lab Tests:  I ordered and personally interpreted labs.  The pertinent results include: CBC, BMP, UA, EtOH    Imaging Studies ordered:  I ordered imaging studies including  MRI brain I independently visualized and interpreted obtained imaging which showed NAD I agree with the radiologist interpretation.   Cardiac Monitoring:  The patient was maintained on a cardiac monitor.  I personally viewed and interpreted the cardiac monitor which showed an underlying rhythm of: NSR   Medicines ordered:  I ordered medication including Ativan  for anxiety  Reevaluation of the patient after these medicines showed that the patient: improved   Problem List / ED Course:  Seizure like activity   Reevaluation:  After the interventions noted above, I reevaluated the patient and found that they have: improved  Disposition:  After consideration of the diagnostic results and the patients response to treatment, I feel that the patent would benefit from close outpatient followup.          Final Clinical Impression(s) / ED Diagnoses Final diagnoses:  Seizure-like activity [facility])    Rx / DC Orders ED Discharge Orders     None         Valarie Merino, MD 10/23/22 1458

## 2022-10-23 NOTE — Discharge Instructions (Addendum)
Return for any problem.  ?

## 2022-10-24 LAB — URINE CULTURE: Culture: <10000 — AB

## 2022-10-24 LAB — LEVETIRACETAM LEVEL: Levetiracetam Lvl: 10.8 ug/mL (ref 10.0–40.0)

## 2023-03-15 ENCOUNTER — Encounter (HOSPITAL_BASED_OUTPATIENT_CLINIC_OR_DEPARTMENT_OTHER): Payer: Self-pay

## 2023-03-15 ENCOUNTER — Other Ambulatory Visit: Payer: Self-pay

## 2023-03-15 ENCOUNTER — Emergency Department (HOSPITAL_BASED_OUTPATIENT_CLINIC_OR_DEPARTMENT_OTHER)
Admission: EM | Admit: 2023-03-15 | Discharge: 2023-03-15 | Disposition: A | Discharge status: Home / Self Care | Payer: BC Managed Care – PPO | Attending: Emergency Medicine | Admitting: Emergency Medicine

## 2023-03-15 ENCOUNTER — Emergency Department (HOSPITAL_BASED_OUTPATIENT_CLINIC_OR_DEPARTMENT_OTHER): Payer: BC Managed Care – PPO

## 2023-03-15 DIAGNOSIS — R059 Cough, unspecified: Secondary | ICD-10-CM | POA: Diagnosis present

## 2023-03-15 DIAGNOSIS — Z1152 Encounter for screening for COVID-19: Secondary | ICD-10-CM | POA: Diagnosis not present

## 2023-03-15 DIAGNOSIS — J209 Acute bronchitis, unspecified: Secondary | ICD-10-CM

## 2023-03-15 LAB — SARS CORONAVIRUS 2 BY RT PCR: SARS Coronavirus 2 by RT PCR: NEGATIVE

## 2023-03-15 NOTE — Discharge Instructions (Signed)
Would recommend following back up with your primary care doctor.  Would recommend a trial of Mucinex DM available over-the-counter.  12-hour tablet.  Today's chest x-ray negative for any pneumonia.  COVID testing was negative.

## 2023-03-15 NOTE — ED Provider Notes (Signed)
[place] [facility] Provider Note   CSN: [id number] Arrival date & time: 03/15/23  1439     History  Chief Complaint  Patient presents with   Cough    Sharon Lamb is a 62 y.o. female.  Patient with a productive cough since May 15.  Has been on through a course of antibiotics.  Had a steroid shot no improvement.  Her chest is now getting sore from the coughing she is coughing up phlegm.  No nausea vomiting or diarrhea no rash.  Primary care doctors in the [facility] area.  Past medical history significant for high cholesterol gastroesophageal reflux disease history of migraines fibromyalgia and kidney stones.  Past surgical history significant for abdominal hysterectomy.  Patient is not a tobacco user has never smoked.       Home Medications Prior to Admission medications   Medication Sig Start Date End Date Taking? Authorizing Provider  celecoxib (CELEBREX) 200 MG capsule  12/28/14   [provider]  Diclofenac Sodium CR 100 MG 24 hr tablet Take 1 tablet (100 mg total) by mouth daily. 06/28/19   Palumbo, April, MD  DULoxetine (CYMBALTA) 20 MG capsule Take 20 mg by mouth daily.    [provider]  esomeprazole (NEXIUM) 40 MG capsule TAKE ONE CAPSULE BY MOUTH TWICE DAILY 30 MINUTES BEFORE BREAKFAST AND DINNER 01/01/15   [provider]  estradiol (ESTRACE) 2 MG tablet TAKE 1 TABLET BY MOUTH DAILY FOR MENOPAUSE** STOP THE 1 MG TABLET** 12/04/14 01/30/18  [provider]  fluticasone (FLONASE) 50 MCG/ACT nasal spray Place 2 sprays into both nostrils daily. 11/09/14   Ward, Layla Maw, DO  ibuprofen (ADVIL,MOTRIN) 800 MG tablet Take 1 tablet (800 mg total) by mouth every 8 (eight) hours as needed. 10/29/15   Trixie Dredge, PA-C  montelukast (SINGULAIR) 10 MG tablet Take 10 mg by mouth at bedtime.    [provider]  ondansetron (ZOFRAN ODT) 4 MG disintegrating tablet Take 1 tablet (4 mg total) by mouth every 8  (eight) hours as needed for nausea or vomiting. 01/24/21   Mendez, Ambrose Finland, MD  oxyCODONE-acetaminophen (PERCOCET) 5-325 MG tablet Take 1 tablet by mouth every 6 (six) hours as needed. 06/24/19   Dartha Lodge, PA-C  potassium chloride SA (K-DUR,KLOR-CON) 20 MEQ tablet Take 1 tablet (20 mEq total) by mouth daily. 01/30/18   Cathren Laine, MD  simvastatin (ZOCOR) 40 MG tablet  12/28/14   [provider]  tamsulosin (FLOMAX) 0.4 MG CAPS capsule Take 1 capsule (0.4 mg total) by mouth daily. 06/24/19   Dartha Lodge, PA-C  tiZANidine (ZANAFLEX) 2 MG tablet Take 2 mg by mouth 2 (two) times daily.    [provider]  traZODone (DESYREL) 100 MG tablet Take 100 mg by mouth at bedtime.    [provider]  Vitamin D, Ergocalciferol, (DRISDOL) 50000 units CAPS capsule Take 50,000 Units by mouth every 7 (seven) days.    [provider]      Allergies    Tetracyclines & related and Fentanyl    Review of Systems   Review of Systems  HENT:  Positive for congestion.   Respiratory:  Positive for cough.   Cardiovascular:  Positive for chest pain.    Physical Exam Updated Vital Signs BP 113/71 (BP Location: Left Arm)   Pulse (!) 56   Temp 98.2 F (36.8 C) (Oral)   Resp 16   Ht 1.676 m (5\' 6" )  Wt 102.5 kg   SpO2 99%   BMI 36.48 kg/m  Physical Exam Vitals and nursing note reviewed.  Constitutional:      General: She is not in acute distress.    Appearance: Normal appearance. She is well-developed.  HENT:     Head: Normocephalic and atraumatic.     Mouth/Throat:     Mouth: Mucous membranes are moist.     Pharynx: No oropharyngeal exudate or posterior oropharyngeal erythema.  Eyes:     Extraocular Movements: Extraocular movements intact.     Conjunctiva/sclera: Conjunctivae normal.     Pupils: Pupils are equal, round, and reactive to light.  Cardiovascular:     Rate and Rhythm: Normal rate and regular rhythm.     Heart sounds: No murmur  heard. Pulmonary:     Effort: Pulmonary effort is normal. No respiratory distress.     Breath sounds: Normal breath sounds. No stridor. No wheezing, rhonchi or rales.  Abdominal:     Palpations: Abdomen is soft.     Tenderness: There is no abdominal tenderness.  Musculoskeletal:        General: No swelling.     Cervical back: Neck supple.  Skin:    General: Skin is warm and dry.     Capillary Refill: Capillary refill takes less than 2 seconds.  Neurological:     General: No focal deficit present.     Mental Status: She is alert and oriented to person, place, and time.  Psychiatric:        Mood and Affect: Mood normal.     ED Results / Procedures / Treatments   Labs (all labs ordered are listed, but only abnormal results are displayed) Labs Reviewed  SARS CORONAVIRUS 2 BY RT PCR    EKG EKG Interpretation  Date/Time:  Sunday March 15 2023 14:49:03 EDT Ventricular Rate:  58 PR Interval:  189 QRS Duration: 95 QT Interval:  436 QTC Calculation: 429 R Axis:   -16 Text Interpretation: Sinus rhythm Borderline left axis deviation RSR' in V1 or V2, right VCD or RVH Borderline T abnormalities, anterior leads No significant change since last tracing Confirmed by Vanetta Mulders [phone]) on 03/15/2023 4:41:54 PM  Radiology DG Chest 2 View  Result Date: 03/15/2023 CLINICAL DATA:  Cough and congestion EXAM: CHEST - 2 VIEW COMPARISON:  April 18, 2022 FINDINGS: The heart size and mediastinal contours are within normal limits. Both lungs are clear. The visualized skeletal structures are unremarkable. IMPRESSION: No active cardiopulmonary disease. Electronically Signed   By: Gerome Sam III M.D.   On: 03/15/2023 15:28    Procedures Procedures    Medications Ordered in ED Medications - No data to display  ED Course/ Medical Decision Making/ A&P                             Medical Decision Making Amount and/or Complexity of Data Reviewed Radiology: ordered.   Patient's chest  x-ray negative COVID testing negative.  No signs of pneumonia.  Patient nontoxic no acute distress.  Oxygen saturation 99%.  Think patient has a persistent viral bronchitis.  She is not a smoker.  Is already been through a course of antibiotics already been attempted steroids without any improvement.  Patient using over-the-counter cold and flu medicines.  I think her symptoms would be better treated by a focus approach with Mucinex DM 12-hour tablet because her main concern is the cough and the productive part of  the cough.  Patient will follow back up with primary care doctor.  Patient does not need a work note. Final Clinical Impression(s) / ED Diagnoses Final diagnoses:  Acute bronchitis, unspecified organism    Rx / DC Orders ED Discharge Orders     None         Vanetta Mulders, MD 03/15/23 1702

## 2023-03-15 NOTE — ED Triage Notes (Signed)
Pt reports cough and congestion since May 15 th . Took antibiotics . Chest soreness from coughing. Nasal congestion, throat irritation and burning

## 2023-05-11 ENCOUNTER — Encounter (HOSPITAL_BASED_OUTPATIENT_CLINIC_OR_DEPARTMENT_OTHER): Payer: Self-pay

## 2023-05-11 ENCOUNTER — Emergency Department (HOSPITAL_BASED_OUTPATIENT_CLINIC_OR_DEPARTMENT_OTHER)
Admission: EM | Admit: 2023-05-11 | Discharge: 2023-05-11 | Disposition: A | Discharge status: Home / Self Care | Payer: BC Managed Care – PPO | Source: Home / Self Care | Attending: Emergency Medicine | Admitting: Emergency Medicine

## 2023-05-11 ENCOUNTER — Emergency Department (HOSPITAL_BASED_OUTPATIENT_CLINIC_OR_DEPARTMENT_OTHER): Payer: BC Managed Care – PPO

## 2023-05-11 ENCOUNTER — Other Ambulatory Visit: Payer: Self-pay

## 2023-05-11 DIAGNOSIS — R252 Cramp and spasm: Secondary | ICD-10-CM | POA: Insufficient documentation

## 2023-05-11 DIAGNOSIS — R600 Localized edema: Secondary | ICD-10-CM | POA: Insufficient documentation

## 2023-05-11 DIAGNOSIS — B349 Viral infection, unspecified: Secondary | ICD-10-CM | POA: Diagnosis not present

## 2023-05-11 DIAGNOSIS — M2550 Pain in unspecified joint: Secondary | ICD-10-CM

## 2023-05-11 DIAGNOSIS — Z20822 Contact with and (suspected) exposure to covid-19: Secondary | ICD-10-CM | POA: Diagnosis not present

## 2023-05-11 DIAGNOSIS — M79671 Pain in right foot: Secondary | ICD-10-CM | POA: Diagnosis not present

## 2023-05-11 DIAGNOSIS — M7989 Other specified soft tissue disorders: Secondary | ICD-10-CM

## 2023-05-11 LAB — SARS CORONAVIRUS 2 BY RT PCR: SARS Coronavirus 2 by RT PCR: NEGATIVE

## 2023-05-11 MED ORDER — MORPHINE SULFATE (PF) 2 MG/ML IV SOLN
2.0000 mg | Freq: Once | INTRAVENOUS | Status: AC
  Administered 2023-05-11: 2 mg via INTRAMUSCULAR
  Filled 2023-05-11: qty 1

## 2023-05-11 MED ORDER — ONDANSETRON 4 MG PO TBDP
4.0000 mg | ORAL_TABLET | Freq: Once | ORAL | Status: AC
  Administered 2023-05-11: 4 mg via ORAL
  Filled 2023-05-11: qty 1

## 2023-05-11 MED ORDER — DEXAMETHASONE SODIUM PHOSPHATE 10 MG/ML IJ SOLN
10.0000 mg | Freq: Once | INTRAMUSCULAR | Status: AC
  Administered 2023-05-11: 10 mg via INTRAMUSCULAR
  Filled 2023-05-11: qty 1

## 2023-05-11 NOTE — Discharge Instructions (Addendum)
Thank you for letting us take care of you today.  Your chest x-ray and EKG were normal.  The x-ray of your foot did show some chronic changes but nothing new.  Follow-up with orthopedics if you continue to have issues with your foot.  Your COVID swab was negative.  As you are having the diffuse bodyaches, I do recommend discussing this with your primary care if it continues.  It could be related to a viral syndrome, medication side effect such as of your cholesterol medication, or other cause.  Follow-up closely with your primary and see if you need to be referred to a specialist.  If you develop new or worsening symptoms, return to the nearest ED for reevaluation.

## 2023-05-11 NOTE — ED Triage Notes (Signed)
In for eval of body aches, cramping, and joint pain x 1 week. Also C/O right foot swelling. Denies fever, chills, cough, HA, N/V.

## 2023-05-11 NOTE — ED Provider Notes (Signed)
Sharon Lamb Provider Note   CSN: [id number] Arrival date & time: 05/11/23  1326     History  Chief Complaint  Patient presents with   Generalized Body Aches    Sharon Lamb is a 62 y.o. female with past medical history fibromyalgia, GERD, hyperlipidemia who presents to the ED with 2 primary complaints.  She states that for the last week she has had generalized bodyaches and cramping.  No associated fever, cough, congestion, nausea, vomiting, or diarrhea.  No known sick contacts.  No headache or sore throat.  No chest pain or shortness of breath though she does state that she has some soreness around the previous biopsy site.  She also complains of pain and swelling to her right foot.  No fall or injury.  This has been going on for about a week and a half.  She did recently have a left breast biopsy which was negative for metastatic disease.  No other recent surgeries or procedures.  No recent long distance travel. No history of DVT/PE.       Home Medications Prior to Admission medications   Medication Sig Start Date End Date Taking? Authorizing Provider  celecoxib (CELEBREX) 200 MG capsule  12/28/14   [provider]  Diclofenac Sodium CR 100 MG 24 hr tablet Take 1 tablet (100 mg total) by mouth daily. 06/28/19   Palumbo, April, MD  DULoxetine (CYMBALTA) 20 MG capsule Take 20 mg by mouth daily.    [provider]  esomeprazole (NEXIUM) 40 MG capsule TAKE ONE CAPSULE BY MOUTH TWICE DAILY 30 MINUTES BEFORE BREAKFAST AND DINNER 01/01/15   [provider]  estradiol (ESTRACE) 2 MG tablet TAKE 1 TABLET BY MOUTH DAILY FOR MENOPAUSE** STOP THE 1 MG TABLET** 12/04/14 01/30/18  [provider]  fluticasone (FLONASE) 50 MCG/ACT nasal spray Place 2 sprays into both nostrils daily. 11/09/14   Ward, Layla Maw, DO  ibuprofen (ADVIL,MOTRIN) 800 MG tablet Take 1 tablet (800 mg total) by mouth every 8 (eight) hours as  needed. 10/29/15   Trixie Dredge, PA-C  montelukast (SINGULAIR) 10 MG tablet Take 10 mg by mouth at bedtime.    [provider]  ondansetron (ZOFRAN ODT) 4 MG disintegrating tablet Take 1 tablet (4 mg total) by mouth every 8 (eight) hours as needed for nausea or vomiting. 01/24/21   Urizar, Ambrose Finland, MD  oxyCODONE-acetaminophen (PERCOCET) 5-325 MG tablet Take 1 tablet by mouth every 6 (six) hours as needed. 06/24/19   Dartha Lodge, PA-C  potassium chloride SA (K-DUR,KLOR-CON) 20 MEQ tablet Take 1 tablet (20 mEq total) by mouth daily. 01/30/18   Cathren Laine, MD  simvastatin (ZOCOR) 40 MG tablet  12/28/14   [provider]  tamsulosin (FLOMAX) 0.4 MG CAPS capsule Take 1 capsule (0.4 mg total) by mouth daily. 06/24/19   Dartha Lodge, PA-C  tiZANidine (ZANAFLEX) 2 MG tablet Take 2 mg by mouth 2 (two) times daily.    [provider]  traZODone (DESYREL) 100 MG tablet Take 100 mg by mouth at bedtime.    [provider]  Vitamin D, Ergocalciferol, (DRISDOL) 50000 units CAPS capsule Take 50,000 Units by mouth every 7 (seven) days.    [provider]      Allergies    Tetracyclines & related and Fentanyl    Review of Systems   Review of Systems  All other systems reviewed and are negative.   Physical Exam Updated Vital Signs BP  110/66 (BP Location: Right Arm)   Pulse (!) 52   Temp 97.8 F (36.6 C)   Resp 18   Ht 5\' 5"  (1.651 m)   Wt 105.2 kg   SpO2 100%   BMI 38.61 kg/m  Physical Exam Vitals and nursing note reviewed.  Constitutional:      General: She is not in acute distress.    Appearance: Normal appearance.  HENT:     Head: Normocephalic and atraumatic.     Mouth/Throat:     Mouth: Mucous membranes are moist.  Eyes:     General: No scleral icterus.    Conjunctiva/sclera: Conjunctivae normal.  Cardiovascular:     Rate and Rhythm: Normal rate and regular rhythm.     Heart sounds: No murmur heard. Pulmonary:     Effort: Pulmonary  effort is normal. No respiratory distress.     Breath sounds: Normal breath sounds. No stridor. No wheezing, rhonchi or rales.  Abdominal:     General: Abdomen is flat. There is no distension.     Palpations: Abdomen is soft.     Tenderness: There is no abdominal tenderness. There is no guarding or rebound.  Musculoskeletal:     Cervical back: Normal range of motion and neck supple. No rigidity.     Left lower leg: No edema.     Comments: Diffuse nonpitting edema to right foot, no overlying erythema, increased warmth, or wounds, 2+ DP and PT pulses, range of motion restricted by swelling, no tenderness to ankle or tib-fib, soft compartments of lower extremity, no calf tenderness  Skin:    General: Skin is warm and dry.     Capillary Refill: Capillary refill takes less than 2 seconds.     Comments: Biopsy site to left medial breast well appearing, no fluctuance, induration, erythema, swelling, or tenderness  Neurological:     Mental Status: She is alert. Mental status is at baseline.  Psychiatric:        Behavior: Behavior normal.     ED Results / Procedures / Treatments   Labs (all labs ordered are listed, but only abnormal results are displayed) Labs Reviewed  SARS CORONAVIRUS 2 BY RT PCR    EKG None  Radiology DG Foot 2 Views Right  Result Date: 05/11/2023 CLINICAL DATA:  Foot pain and swelling. EXAM: RIGHT FOOT - 2 VIEW COMPARISON:  Right ankle radiographs 11/14/2014 FINDINGS: Borderline mild hallux valgus. Moderate chronic enthesopathic change at the Achilles insertion on the calcaneus, similar to prior. Mild talonavicular joint space narrowing. Mild calcaneocuboid peripheral sclerosis and degenerative spurring. No acute fracture or dislocation. No cortical erosion. IMPRESSION: 1. Borderline mild hallux valgus. 2. Moderate chronic enthesopathic change at the Achilles insertion on the calcaneus. Electronically Signed   By: Neita Garnet M.D.   On: 05/11/2023 17:10   DG Chest 2  View  Result Date: 05/11/2023 CLINICAL DATA:  Body aches.  Upper respiratory infection. EXAM: CHEST - 2 VIEW COMPARISON:  Chest radiographs 03/15/2023 FINDINGS: Cardiac silhouette is again mildly enlarged. Mediastinal contours are within normal limits. The lungs are clear. No pleural effusion pneumothorax. Mild multilevel degenerative disc changes of the thoracic spine. Cholecystectomy clips. IMPRESSION: No active cardiopulmonary disease. Electronically Signed   By: Neita Garnet M.D.   On: 05/11/2023 17:08   US Venous Img Lower Right (DVT Study)  Result Date: 05/11/2023 CLINICAL DATA:  Right lower extremity edema.  Evaluate for DVT. EXAM: RIGHT LOWER EXTREMITY VENOUS DOPPLER ULTRASOUND TECHNIQUE: Gray-scale sonography with graded compression, as  well as color Doppler and duplex ultrasound were performed to evaluate the lower extremity deep venous systems from the level of the common femoral vein and including the common femoral, femoral, profunda femoral, popliteal and calf veins including the posterior tibial, peroneal and gastrocnemius veins when visible. The superficial great saphenous vein was also interrogated. Spectral Doppler was utilized to evaluate flow at rest and with distal augmentation maneuvers in the common femoral, femoral and popliteal veins. COMPARISON:  None Available. FINDINGS: Contralateral Common Femoral Vein: Respiratory phasicity is normal and symmetric with the symptomatic side. No evidence of thrombus. Normal compressibility. Common Femoral Vein: No evidence of thrombus. Normal compressibility, respiratory phasicity and response to augmentation. Saphenofemoral Junction: No evidence of thrombus. Normal compressibility and flow on color Doppler imaging. Profunda Femoral Vein: No evidence of thrombus. Normal compressibility and flow on color Doppler imaging. Femoral Vein: No evidence of thrombus. Normal compressibility, respiratory phasicity and response to augmentation. Popliteal Vein:  No evidence of thrombus. Normal compressibility, respiratory phasicity and response to augmentation. Calf Veins: No evidence of thrombus. Normal compressibility and flow on color Doppler imaging. Superficial Great Saphenous Vein: No evidence of thrombus. Normal compressibility. Other Findings:  None. IMPRESSION: No evidence of DVT within the right lower extremity. Electronically Signed   By: Simonne Come M.D.   On: 05/11/2023 16:55    Procedures Procedures    Medications Ordered in ED Medications  dexamethasone (DECADRON) injection 10 mg (has no administration in time range)  morphine (PF) 2 MG/ML injection 2 mg (2 mg Intramuscular Given 05/11/23 1556)  ondansetron (ZOFRAN-ODT) disintegrating tablet 4 mg (4 mg Oral Given 05/11/23 1556)    ED Course/ Medical Decision Making/ A&P                             Medical Decision Making Amount and/or Complexity of Data Reviewed Labs: ordered. Decision-making details documented in ED Course. Radiology: ordered. Decision-making details documented in ED Course.  Risk Prescription drug management.   Medical Decision Making:   Ramika Derman is a 61 y.o. female who presented to the ED today with bodyaches/right foot swelling detailed above.    Patient's presentation is complicated by their history of chronic pain, hyperlipidemia.  Complete initial physical exam performed, notably the patient was in NAD. Nontoxic appearing. RRR, LCTA. She had nonpitting edema to right foot but was neurovascularly intact. No evidence septic joint.    Reviewed and confirmed nursing documentation for past medical history, family history, social history.    Initial Assessment:   With the patient's presentation, differential diagnosis includes but is not limited to viral syndrome, pneumonia, DVT, medication reaction. This is most consistent with an acute complicated illness  Initial Plan:  COVID swab CXR to evaluate for structural/infectious intrathoracic pathology.   EKG to evaluate for cardiac pathology DVT study Objective evaluation as below reviewed   Initial Study Results:   Laboratory  All laboratory results reviewed without evidence of clinically relevant pathology.    EKG EKG was reviewed independently.  Sinus bradycardia.  No acute ST-T changes.  Patient has a bradycardia at baseline.  No STEMI.  Radiology:  All images reviewed independently. Agree with radiology report at this time.   DG Foot 2 Views Right  Result Date: 05/11/2023 CLINICAL DATA:  Foot pain and swelling. EXAM: RIGHT FOOT - 2 VIEW COMPARISON:  Right ankle radiographs 11/14/2014 FINDINGS: Borderline mild hallux valgus. Moderate chronic enthesopathic change at the Achilles insertion on the calcaneus,  similar to prior. Mild talonavicular joint space narrowing. Mild calcaneocuboid peripheral sclerosis and degenerative spurring. No acute fracture or dislocation. No cortical erosion. IMPRESSION: 1. Borderline mild hallux valgus. 2. Moderate chronic enthesopathic change at the Achilles insertion on the calcaneus. Electronically Signed   By: Neita Garnet M.D.   On: 05/11/2023 17:10   DG Chest 2 View  Result Date: 05/11/2023 CLINICAL DATA:  Body aches.  Upper respiratory infection. EXAM: CHEST - 2 VIEW COMPARISON:  Chest radiographs 03/15/2023 FINDINGS: Cardiac silhouette is again mildly enlarged. Mediastinal contours are within normal limits. The lungs are clear. No pleural effusion pneumothorax. Mild multilevel degenerative disc changes of the thoracic spine. Cholecystectomy clips. IMPRESSION: No active cardiopulmonary disease. Electronically Signed   By: Neita Garnet M.D.   On: 05/11/2023 17:08   US Venous Img Lower Right (DVT Study)  Result Date: 05/11/2023 CLINICAL DATA:  Right lower extremity edema.  Evaluate for DVT. EXAM: RIGHT LOWER EXTREMITY VENOUS DOPPLER ULTRASOUND TECHNIQUE: Gray-scale sonography with graded compression, as well as color Doppler and duplex ultrasound were  performed to evaluate the lower extremity deep venous systems from the level of the common femoral vein and including the common femoral, femoral, profunda femoral, popliteal and calf veins including the posterior tibial, peroneal and gastrocnemius veins when visible. The superficial great saphenous vein was also interrogated. Spectral Doppler was utilized to evaluate flow at rest and with distal augmentation maneuvers in the common femoral, femoral and popliteal veins. COMPARISON:  None Available. FINDINGS: Contralateral Common Femoral Vein: Respiratory phasicity is normal and symmetric with the symptomatic side. No evidence of thrombus. Normal compressibility. Common Femoral Vein: No evidence of thrombus. Normal compressibility, respiratory phasicity and response to augmentation. Saphenofemoral Junction: No evidence of thrombus. Normal compressibility and flow on color Doppler imaging. Profunda Femoral Vein: No evidence of thrombus. Normal compressibility and flow on color Doppler imaging. Femoral Vein: No evidence of thrombus. Normal compressibility, respiratory phasicity and response to augmentation. Popliteal Vein: No evidence of thrombus. Normal compressibility, respiratory phasicity and response to augmentation. Calf Veins: No evidence of thrombus. Normal compressibility and flow on color Doppler imaging. Superficial Great Saphenous Vein: No evidence of thrombus. Normal compressibility. Other Findings:  None. IMPRESSION: No evidence of DVT within the right lower extremity. Electronically Signed   By: Simonne Come M.D.   On: 05/11/2023 16:55    Final Assessment and Plan:   Could be related to stain   62 year old female presents to the ED with generalized bodyaches as well as right foot pain and swelling.  She does have a history of fibromyalgia and is on narcotic pain medication at home but states that the symptoms are overall new.  No associated sick contacts.  Afebrile, nontoxic-appearing.  She did have  a recent breast biopsy.  Mass was benign.  No other recent procedures.  She does have nonpitting edema noted to the right foot.  Neurovascularly intact but range of motion is restricted secondary to swelling.  No overlying skin changes.  Do not suspect septic joint.  Viral testing ordered in triage due to patient's generalized bodyaches.  COVID swab negative.  With her symptoms, will order imaging of her foot as well as a DVT study.  Will also order chest x-ray due to recent breast biopsy.  The studies were unremarkable.  Patient does have good pain control following small dose of morphine given in the ED.  Reviewed patient's medication list and she is on a statin but this medication is not needed.  Discussed with  patient that overall workup today is reassuring that she is stable for discharge to follow-up with her PCP.  Discussed that generalized bodyaches could be secondary to viral illness versus statin versus acute on chronic pain, etc.  Recommended close follow-up with primary care and patient is agreeable with this plan.  Will give a dose of steroids here to see if this helps with symptoms.  Patient given strict ED return precautions.  All questions answered and stable for discharge.   Clinical Impression:  1. Viral syndrome   2. Swelling of right foot   3. Arthralgia, unspecified joint      Discharge           Final Clinical Impression(s) / ED Diagnoses Final diagnoses:  Viral syndrome  Swelling of right foot  Arthralgia, unspecified joint    Rx / DC Orders ED Discharge Orders     None         Tonette Lederer, PA-C 05/11/23 1732    Sloan Leiter, DO 05/11/23 2332

## 2023-06-20 ENCOUNTER — Other Ambulatory Visit: Payer: Self-pay

## 2023-06-20 ENCOUNTER — Emergency Department (HOSPITAL_BASED_OUTPATIENT_CLINIC_OR_DEPARTMENT_OTHER)
Admission: EM | Admit: 2023-06-20 | Discharge: 2023-06-20 | Disposition: A | Discharge status: Home / Self Care | Payer: BC Managed Care – PPO | Attending: Emergency Medicine | Admitting: Emergency Medicine

## 2023-06-20 ENCOUNTER — Encounter (HOSPITAL_BASED_OUTPATIENT_CLINIC_OR_DEPARTMENT_OTHER): Payer: Self-pay | Admitting: Emergency Medicine

## 2023-06-20 DIAGNOSIS — L0211 Cutaneous abscess of neck: Secondary | ICD-10-CM | POA: Diagnosis not present

## 2023-06-20 DIAGNOSIS — Z23 Encounter for immunization: Secondary | ICD-10-CM | POA: Diagnosis not present

## 2023-06-20 DIAGNOSIS — M542 Cervicalgia: Secondary | ICD-10-CM | POA: Diagnosis present

## 2023-06-20 MED ORDER — IBUPROFEN 600 MG PO TABS: 600.0000 mg | ORAL_TABLET | Freq: Four times a day (QID) | ORAL | 0 refills | Status: AC | PRN

## 2023-06-20 MED ORDER — SULFAMETHOXAZOLE-TRIMETHOPRIM 800-160 MG PO TABS: 1.0000 | ORAL_TABLET | Freq: Two times a day (BID) | ORAL | 0 refills | Status: AC

## 2023-06-20 MED ORDER — LIDOCAINE-EPINEPHRINE (PF) 2 %-1:200000 IJ SOLN
10.0000 mL | Freq: Once | INTRAMUSCULAR | Status: AC
  Administered 2023-06-20: 10 mL via INTRADERMAL
  Filled 2023-06-20: qty 20

## 2023-06-20 MED ORDER — FLUCONAZOLE 150 MG PO TABS: 150.0000 mg | ORAL_TABLET | Freq: Every day | ORAL | 0 refills | Status: AC

## 2023-06-20 MED ORDER — IBUPROFEN 800 MG PO TABS
800.0000 mg | ORAL_TABLET | Freq: Once | ORAL | Status: AC
  Administered 2023-06-20: 800 mg via ORAL
  Filled 2023-06-20: qty 1

## 2023-06-20 MED ORDER — TETANUS-DIPHTH-ACELL PERTUSSIS 5-2.5-18.5 LF-MCG/0.5 IM SUSY
0.5000 mL | PREFILLED_SYRINGE | Freq: Once | INTRAMUSCULAR | Status: AC
  Administered 2023-06-20: 0.5 mL via INTRAMUSCULAR
  Filled 2023-06-20: qty 0.5

## 2023-06-20 NOTE — ED Triage Notes (Signed)
Pt with abscess to LT posterior head/neck x 2 wks, now draining

## 2023-06-20 NOTE — ED Provider Notes (Signed)
[place] [facility] Provider Note   CSN: [id number] Arrival date & time: 06/20/23  1402     History  Chief Complaint  Patient presents with   Abscess    Sharon Lamb is a 62 y.o. female.  The history is provided by the patient and medical records. No language interpreter was used.  Abscess    62 year old female significant history of fibromyalgia, degenerative disease, hypercholesterolemia, migraine, presenting with complaints of skin infection.  Patient report she noticed a lump behind her left ear about 2 weeks ago.  It has increased in size and it is tender when palpated.  Today it drains out a [place] bit of brownish fluid and she endorsed increasing pain prompting this ER visit.  She does not endorse any fever or chills no significant headache no hearing changes no drainage from her ear denies any trauma and no significant neck pain.  She is unsure her last tetanus status.  No history of skin abscess.  Home Medications Prior to Admission medications   Medication Sig Start Date End Date Taking? Authorizing Provider  celecoxib (CELEBREX) 200 MG capsule  12/28/14   [provider]  Diclofenac Sodium CR 100 MG 24 hr tablet Take 1 tablet (100 mg total) by mouth daily. 06/28/19   Palumbo, April, MD  DULoxetine (CYMBALTA) 20 MG capsule Take 20 mg by mouth daily.    [provider]  esomeprazole (NEXIUM) 40 MG capsule TAKE ONE CAPSULE BY MOUTH TWICE DAILY 30 MINUTES BEFORE BREAKFAST AND DINNER 01/01/15   [provider]  estradiol (ESTRACE) 2 MG tablet TAKE 1 TABLET BY MOUTH DAILY FOR MENOPAUSE** STOP THE 1 MG TABLET** 12/04/14 01/30/18  [provider]  fluticasone (FLONASE) 50 MCG/ACT nasal spray Place 2 sprays into both nostrils daily. 11/09/14   Ward, Layla Maw, DO  ibuprofen (ADVIL,MOTRIN) 800 MG tablet Take 1 tablet (800 mg total) by mouth every 8 (eight) hours as needed. 10/29/15   Trixie Dredge, PA-C  montelukast  (SINGULAIR) 10 MG tablet Take 10 mg by mouth at bedtime.    [provider]  ondansetron (ZOFRAN ODT) 4 MG disintegrating tablet Take 1 tablet (4 mg total) by mouth every 8 (eight) hours as needed for nausea or vomiting. 01/24/21   Kannan, Ambrose Finland, MD  oxyCODONE-acetaminophen (PERCOCET) 5-325 MG tablet Take 1 tablet by mouth every 6 (six) hours as needed. 06/24/19   Dartha Lodge, PA-C  potassium chloride SA (K-DUR,KLOR-CON) 20 MEQ tablet Take 1 tablet (20 mEq total) by mouth daily. 01/30/18   Cathren Laine, MD  simvastatin (ZOCOR) 40 MG tablet  12/28/14   [provider]  tamsulosin (FLOMAX) 0.4 MG CAPS capsule Take 1 capsule (0.4 mg total) by mouth daily. 06/24/19   Dartha Lodge, PA-C  tiZANidine (ZANAFLEX) 2 MG tablet Take 2 mg by mouth 2 (two) times daily.    [provider]  traZODone (DESYREL) 100 MG tablet Take 100 mg by mouth at bedtime.    [provider]  Vitamin D, Ergocalciferol, (DRISDOL) 50000 units CAPS capsule Take 50,000 Units by mouth every 7 (seven) days.    [provider]      Allergies    Tetracyclines & related and Fentanyl    Review of Systems   Review of Systems  All other systems reviewed and are negative.   Physical Exam Updated Vital Signs BP 130/72 (BP Location: Right Arm)   Pulse (!) 48   Temp 98.4 F (36.9 C) (  Oral)   Resp 17   Ht 5\' 5"  (1.651 m)   Wt 105.2 kg   SpO2 100%   BMI 38.59 kg/m  Physical Exam Vitals and nursing note reviewed.  Constitutional:      General: She is not in acute distress.    Appearance: She is well-developed.  HENT:     Head: Atraumatic.  Eyes:     Conjunctiva/sclera: Conjunctivae normal.  Neck:     Comments: Posterior to left ear there is an area of induration and fluctuant approximately 3 cm in diameter and it is tender to palpation.  No pulsatile mass.  Ear exam unremarkable. Pulmonary:     Effort: Pulmonary effort is normal.  Musculoskeletal:     Cervical back:  Neck supple.  Skin:    Findings: No rash.  Neurological:     Mental Status: She is alert.  Psychiatric:        Mood and Affect: Mood normal.     ED Results / Procedures / Treatments   Labs (all labs ordered are listed, but only abnormal results are displayed) Labs Reviewed - No data to display  EKG None  Radiology No results found.  Procedures .Marland KitchenIncision and Drainage  Date/Time: 06/20/2023 3:53 PM  Performed by: Fayrene Helper, PA-C Authorized by: Fayrene Helper, PA-C   Consent:    Consent obtained:  Verbal   Consent given by:  Patient   Risks discussed:  Bleeding, incomplete drainage, pain and damage to other organs   Alternatives discussed:  No treatment Universal protocol:    Procedure explained and questions answered to patient or proxy's satisfaction: yes     Relevant documents present and verified: yes     Test results available : yes     Imaging studies available: yes     Required blood products, implants, devices, and special equipment available: yes     Site/side marked: yes     Immediately prior to procedure, a time out was called: yes     Patient identity confirmed:  Verbally with patient Location:    Type:  Abscess   Size:  3   Location:  Neck   Neck location:  L posterior Pre-procedure details:    Skin preparation:  Povidone-iodine Sedation:    Sedation type:  None Anesthesia:    Anesthesia method:  Local infiltration   Local anesthetic:  Lidocaine 2% WITH epi Procedure type:    Complexity:  Complex Procedure details:    Incision types:  Single straight   Incision depth:  Subcutaneous   Wound management:  Probed and deloculated, irrigated with saline and extensive cleaning   Drainage:  Purulent   Drainage amount:  Moderate   Packing materials:  None Post-procedure details:    Procedure completion:  Tolerated well, no immediate complications     Medications Ordered in ED Medications  lidocaine-EPINEPHrine (XYLOCAINE W/EPI) 2 %-1:200000 (PF)  injection 10 mL (10 mLs Intradermal Given by Other 06/20/23 1520)  Tdap (BOOSTRIX) injection 0.5 mL (0.5 mLs Intramuscular Given 06/20/23 1519)  ibuprofen (ADVIL) tablet 800 mg (800 mg Oral Given 06/20/23 1529)    ED Course/ Medical Decision Making/ A&P                                 Medical Decision Making Risk Prescription drug management.   BP 130/72 (BP Location: Right Arm)   Pulse (!) 48   Temp 98.4 F (36.9 C) (Oral)  Resp 17   Ht 5\' 5"  (1.651 m)   Wt 105.2 kg   SpO2 100%   BMI 38.59 kg/m   26:66 PM  62 year old female significant history of fibromyalgia, degenerative disease, hypercholesterolemia, migraine, presenting with complaints of skin infection.  Patient report she noticed a lump behind her left ear about 2 weeks ago.  It has increased in size and it is tender when palpated.  Today it drains out a [place] bit of brownish fluid and she endorsed increasing pain prompting this ER visit.  She does not endorse any fever or chills no significant headache no hearing changes no drainage from her ear denies any trauma and no significant neck pain.  She is unsure her last tetanus status.  No history of skin abscess.  On exam this is a well-appearing female resting comfortably in bed appears to be in no acute discomfort.  She has a subcutaneous palpable nodule noted posterior to her left ear likely a cyst versus an abscess.  Low suspicion for a pseudoaneurysm.  It could also be a reactive lymph nodes but her ear exam unremarkable.  Plan to perform incision and drainage.  Will update her tetanus.  3:55 PM Successful I&D with moderate purulent discharge expressed from the wound.  Packing was not used.  Will discharge home with antibiotic, appropriate I&D care instruction and will have patient return if symptoms worsen.  Imaging including bedside ultrasound considered but abscess is subcutaneous and easily visualized therefore no additional imaging indicated.  Labs was not obtained.  After  incision drainage patient report feeling better.        Final Clinical Impression(s) / ED Diagnoses Final diagnoses:  Cutaneous abscess of neck    Rx / DC Orders ED Discharge Orders          Ordered    ibuprofen (ADVIL) 600 MG tablet  Every 6 hours PRN        06/20/23 1556    sulfamethoxazole-trimethoprim (BACTRIM DS) 800-160 MG tablet  2 times daily        06/20/23 1556              Fayrene Helper, PA-C 06/20/23 1558    Jacalyn Lefevre, MD 06/21/23 [phone]

## 2023-06-20 NOTE — Discharge Instructions (Addendum)
Please continue to apply warm moist compress to the abscess several times daily to aid with healing.  Take antibiotic as prescribed.  If you notice no improvement or your symptoms worsen please return to ER for further care.

## 2023-08-23 ENCOUNTER — Emergency Department (HOSPITAL_BASED_OUTPATIENT_CLINIC_OR_DEPARTMENT_OTHER)
Admission: EM | Admit: 2023-08-23 | Discharge: 2023-08-23 | Disposition: A | Discharge status: Home / Self Care | Payer: BC Managed Care – PPO | Attending: Emergency Medicine | Admitting: Emergency Medicine

## 2023-08-23 ENCOUNTER — Emergency Department (HOSPITAL_BASED_OUTPATIENT_CLINIC_OR_DEPARTMENT_OTHER): Payer: BC Managed Care – PPO

## 2023-08-23 ENCOUNTER — Encounter (HOSPITAL_BASED_OUTPATIENT_CLINIC_OR_DEPARTMENT_OTHER): Payer: Self-pay | Admitting: Emergency Medicine

## 2023-08-23 ENCOUNTER — Other Ambulatory Visit: Payer: Self-pay

## 2023-08-23 DIAGNOSIS — R519 Headache, unspecified: Secondary | ICD-10-CM | POA: Diagnosis present

## 2023-08-23 DIAGNOSIS — Z87442 Personal history of urinary calculi: Secondary | ICD-10-CM | POA: Insufficient documentation

## 2023-08-23 LAB — BASIC METABOLIC PANEL
Anion gap: 9 (ref 5–15)
BUN: 11 mg/dL (ref 8–23)
CO2: 24 mmol/L (ref 22–32)
Calcium: 8.5 mg/dL — ABNORMAL LOW (ref 8.9–10.3)
Chloride: 105 mmol/L (ref 98–111)
Creatinine, Ser: 1.07 mg/dL — ABNORMAL HIGH (ref 0.44–1.00)
GFR, Estimated: 59 mL/min — ABNORMAL LOW (ref >60)
Glucose, Bld: 89 mg/dL (ref 70–99)
Potassium: 4.1 mmol/L (ref 3.5–5.1)
Sodium: 138 mmol/L (ref 135–145)

## 2023-08-23 MED ORDER — METOCLOPRAMIDE HCL 10 MG PO TABS
10.0000 mg | ORAL_TABLET | Freq: Once | ORAL | Status: AC
  Administered 2023-08-23: 10 mg via ORAL
  Filled 2023-08-23: qty 1

## 2023-08-23 MED ORDER — KETOROLAC TROMETHAMINE 15 MG/ML IJ SOLN
15.0000 mg | Freq: Once | INTRAMUSCULAR | Status: DC
  Filled 2023-08-23: qty 1

## 2023-08-23 MED ORDER — KETOROLAC TROMETHAMINE 15 MG/ML IJ SOLN
15.0000 mg | Freq: Once | INTRAMUSCULAR | Status: AC
Start: 2023-08-23 — End: 2023-08-23
  Administered 2023-08-23: 15 mg via INTRAMUSCULAR

## 2023-08-23 MED ORDER — ACETAMINOPHEN 325 MG PO TABS
975.0000 mg | ORAL_TABLET | Freq: Once | ORAL | Status: AC
  Administered 2023-08-23: 975 mg via ORAL
  Filled 2023-08-23: qty 3

## 2023-08-23 MED ORDER — DIPHENHYDRAMINE HCL 25 MG PO CAPS
25.0000 mg | ORAL_CAPSULE | Freq: Once | ORAL | Status: AC
  Administered 2023-08-23: 25 mg via ORAL
  Filled 2023-08-23: qty 1

## 2023-08-23 NOTE — ED Provider Notes (Signed)
[place] [facility] Provider Note   CSN: [id number] Arrival date & time: 08/23/23  1340     History  Chief Complaint  Patient presents with   Headache    Sharon Lamb is a 62 y.o. female with medical history of arthritis, fibromyalgia, GERD, DDD, kidney stones and migraines.  Patient presents to the ED for evaluation of headache.  She reports that on Friday she hit her head on a towel rack and has had a persistent headache as well as dizziness when she bends over since this time.  She denies loss of consciousness, blood thinning medications.  Denies nausea, vomiting, photophobia, phonophobia.  Reports he does have a history of migraines but this does not feel like a migraine.  States that she tried taking ibuprofen one time and it did not relieve her symptoms.  Denies chest pain, shortness of breath.  Denies this being the worst headache of her life. Reports that she does have a history of low heart rate but this is known to her, she is slightly bradycardic.   Headache      Home Medications Prior to Admission medications   Medication Sig Start Date End Date Taking? Authorizing Provider  esomeprazole (NEXIUM) 40 MG capsule TAKE ONE CAPSULE BY MOUTH TWICE DAILY 30 MINUTES BEFORE BREAKFAST AND DINNER 01/01/15  Yes [provider]  estradiol (ESTRACE) 2 MG tablet Take 2 mg by mouth daily. TAKE 1 TABLET BY MOUTH DAILY FOR MENOPAUSE** STOP THE 1 MG TABLET** 12/04/14 08/23/23 Yes [provider]  fluticasone (FLONASE) 50 MCG/ACT nasal spray Place 2 sprays into both nostrils daily. 11/09/14  Yes Ward, Layla Maw, DO  ibuprofen (ADVIL) 600 MG tablet Take 1 tablet (600 mg total) by mouth every 6 (six) hours as needed. 06/20/23  Yes Fayrene Helper, PA-C  montelukast (SINGULAIR) 10 MG tablet Take 10 mg by mouth at bedtime.   Yes [provider]  oxyCODONE-acetaminophen (PERCOCET) 5-325 MG tablet Take 1 tablet by mouth every 6 (six) hours  as needed. 06/24/19  Yes Dartha Lodge, PA-C  tiZANidine (ZANAFLEX) 2 MG tablet Take 2 mg by mouth 2 (two) times daily.   Yes [provider]  Vitamin D, Ergocalciferol, (DRISDOL) 50000 units CAPS capsule Take 50,000 Units by mouth every 7 (seven) days.   Yes [provider]  celecoxib (CELEBREX) 200 MG capsule  12/28/14   [provider]  Diclofenac Sodium CR 100 MG 24 hr tablet Take 1 tablet (100 mg total) by mouth daily. 06/28/19   Palumbo, April, MD  DULoxetine (CYMBALTA) 20 MG capsule Take 20 mg by mouth daily.    [provider]  fluconazole (DIFLUCAN) 150 MG tablet Take 1 tablet (150 mg total) by mouth daily. 06/20/23   Fayrene Helper, PA-C  ondansetron (ZOFRAN ODT) 4 MG disintegrating tablet Take 1 tablet (4 mg total) by mouth every 8 (eight) hours as needed for nausea or vomiting. 01/24/21   Gamarra, Ambrose Finland, MD  potassium chloride SA (K-DUR,KLOR-CON) 20 MEQ tablet Take 1 tablet (20 mEq total) by mouth daily. 01/30/18   Cathren Laine, MD  simvastatin (ZOCOR) 40 MG tablet  12/28/14   [provider]  tamsulosin (FLOMAX) 0.4 MG CAPS capsule Take 1 capsule (0.4 mg total) by mouth daily. 06/24/19   Dartha Lodge, PA-C  traZODone (DESYREL) 100 MG tablet Take 100 mg by mouth at bedtime.    [provider]      Allergies    Tetracyclines &  related and Fentanyl    Review of Systems   Review of Systems  Neurological:  Positive for headaches.  All other systems reviewed and are negative.   Physical Exam Updated Vital Signs BP (!) 153/82 (BP Location: Right Arm)   Pulse (!) 47   Temp 98.1 F (36.7 C) (Oral)   Resp 16   Ht 5\' 5"  (1.651 m)   Wt 105.2 kg   SpO2 100%   BMI 38.61 kg/m  Physical Exam Vitals and nursing note reviewed.  Constitutional:      General: She is not in acute distress.    Appearance: Normal appearance. She is not ill-appearing, toxic-appearing or diaphoretic.  HENT:     Head: Normocephalic and atraumatic.      Nose: Nose normal.     Mouth/Throat:     Mouth: Mucous membranes are moist.     Pharynx: Oropharynx is clear.  Eyes:     Extraocular Movements: Extraocular movements intact.     Conjunctiva/sclera: Conjunctivae normal.     Pupils: Pupils are equal, round, and reactive to light.  Cardiovascular:     Rate and Rhythm: Normal rate and regular rhythm.  Pulmonary:     Effort: Pulmonary effort is normal.     Breath sounds: Normal breath sounds. No wheezing.  Abdominal:     General: Abdomen is flat. Bowel sounds are normal.     Palpations: Abdomen is soft.     Tenderness: There is no abdominal tenderness.  Musculoskeletal:     Cervical back: Normal range of motion and neck supple. No tenderness.  Skin:    Capillary Refill: Capillary refill takes less than 2 seconds.  Neurological:     General: No focal deficit present.     Mental Status: She is alert and oriented to person, place, and time.     GCS: GCS eye subscore is 4. GCS verbal subscore is 5. GCS motor subscore is 6.     Cranial Nerves: Cranial nerves 2-12 are intact. No cranial nerve deficit.     Sensory: Sensation is intact. No sensory deficit.     Motor: Motor function is intact. No weakness.     Coordination: Coordination is intact. Heel to [facility] Test normal.     Comments: No focal deficits.  Reassuring neurological examination.     ED Results / Procedures / Treatments   Labs (all labs ordered are listed, but only abnormal results are displayed) Labs Reviewed  BASIC METABOLIC PANEL - Abnormal; Notable for the following components:      Result Value   Creatinine, Ser 1.07 (*)    Calcium 8.5 (*)    GFR, Estimated 59 (*)    All other components within normal limits  CBC    EKG None  Radiology CT Head Wo Contrast  Result Date: 08/23/2023 CLINICAL DATA:  Fall 2 days ago with head trauma and pain. EXAM: CT HEAD WITHOUT CONTRAST TECHNIQUE: Contiguous axial images were obtained from the base of the skull through the  vertex without intravenous contrast. RADIATION DOSE REDUCTION: This exam was performed according to the departmental dose-optimization program which includes automated exposure control, adjustment of the mA and/or kV according to patient size and/or use of iterative reconstruction technique. COMPARISON:  CT head dated 06/17/2022. FINDINGS: Brain: No evidence of acute infarction, hemorrhage, hydrocephalus, extra-axial collection or mass lesion/mass effect. Encephalomalacia of the superior left parietal lobe is unchanged. Hypoattenuation Vascular: There are vascular calcifications in the carotid siphons. Skull: Normal. Negative for fracture or focal  lesion. Sinuses/Orbits: Bilateral mastoid effusions are noted. There is right maxillary and right ethmoid sinus disease. Other: There is mild left periorbital soft tissue swelling. IMPRESSION: 1. No acute intracranial process. Electronically Signed   By: Romona Curls M.D.   On: 08/23/2023 17:42    Procedures Procedures    Medications Ordered in ED Medications  acetaminophen (TYLENOL) tablet 975 mg (975 mg Oral Given 08/23/23 1552)  ketorolac (TORADOL) 15 MG/ML injection 15 mg (15 mg Intramuscular Given 08/23/23 1638)  metoCLOPramide (REGLAN) tablet 10 mg (10 mg Oral Given 08/23/23 1737)  diphenhydrAMINE (BENADRYL) capsule 25 mg (25 mg Oral Given 08/23/23 1737)    ED Course/ Medical Decision Making/ A&P  Medical Decision Making Amount and/or Complexity of Data Reviewed Labs: ordered. Radiology: ordered.  Risk OTC drugs. Prescription drug management.   Patient presents for 2 days of headache after striking her head on a towel rack.  Please see HPI.  Patient overall nontoxic.  Patient neurological examination reassuring.  Bradycardic but states that this is known to her.  Will collect metabolic panel, CT head and treat with migraine cocktail.  Labs reassuring.  Migraine cocktail has improved her symptoms.  CT head unremarkable.  Will discharge  and advised her to continue doing symptomatic care.   Final Clinical Impression(s) / ED Diagnoses Final diagnoses:  Acute nonintractable headache, unspecified headache type    Rx / DC Orders ED Discharge Orders     None         Al Decant, PA-C 08/23/23 1748    Glyn Ade, MD 08/23/23 [phone]

## 2023-08-23 NOTE — ED Notes (Signed)
Patient transported to CT 

## 2023-08-23 NOTE — ED Triage Notes (Addendum)
Pt sts she hit her head on metal towel rack on Fri; she continues to have HA; reports intermittent dizziness since then as well; no blood thinners

## 2023-08-23 NOTE — Discharge Instructions (Signed)
Please continue taking Tylenol and ibuprofen at home for your headache.  Please follow-up with your PCP.  Please return with any new symptoms.

## 2023-09-23 ENCOUNTER — Other Ambulatory Visit (HOSPITAL_BASED_OUTPATIENT_CLINIC_OR_DEPARTMENT_OTHER): Payer: Self-pay

## 2023-09-23 MED ORDER — OXYCODONE-ACETAMINOPHEN 10-325 MG PO TABS
1.0000 | ORAL_TABLET | Freq: Four times a day (QID) | ORAL | 0 refills | Status: AC | PRN
  Filled 2023-09-23: qty 120, 30d supply, fill #0

## 2023-11-03 ENCOUNTER — Other Ambulatory Visit (HOSPITAL_BASED_OUTPATIENT_CLINIC_OR_DEPARTMENT_OTHER): Payer: Self-pay

## 2024-01-06 ENCOUNTER — Emergency Department (HOSPITAL_BASED_OUTPATIENT_CLINIC_OR_DEPARTMENT_OTHER)
Admission: EM | Admit: 2024-01-06 | Discharge: 2024-01-06 | Disposition: A | Discharge status: Home / Self Care | Attending: Emergency Medicine | Admitting: Emergency Medicine

## 2024-01-06 ENCOUNTER — Encounter (HOSPITAL_BASED_OUTPATIENT_CLINIC_OR_DEPARTMENT_OTHER): Payer: Self-pay | Admitting: Emergency Medicine

## 2024-01-06 ENCOUNTER — Other Ambulatory Visit: Payer: Self-pay

## 2024-01-06 ENCOUNTER — Emergency Department (HOSPITAL_BASED_OUTPATIENT_CLINIC_OR_DEPARTMENT_OTHER)

## 2024-01-06 DIAGNOSIS — M79604 Pain in right leg: Secondary | ICD-10-CM | POA: Insufficient documentation

## 2024-01-06 DIAGNOSIS — M25551 Pain in right hip: Secondary | ICD-10-CM | POA: Diagnosis not present

## 2024-01-06 DIAGNOSIS — S39012A Strain of muscle, fascia and tendon of lower back, initial encounter: Secondary | ICD-10-CM | POA: Insufficient documentation

## 2024-01-06 DIAGNOSIS — W19XXXA Unspecified fall, initial encounter: Secondary | ICD-10-CM | POA: Insufficient documentation

## 2024-01-06 DIAGNOSIS — M25561 Pain in right knee: Secondary | ICD-10-CM | POA: Insufficient documentation

## 2024-01-06 DIAGNOSIS — S3992XA Unspecified injury of lower back, initial encounter: Secondary | ICD-10-CM | POA: Diagnosis present

## 2024-01-06 NOTE — ED Triage Notes (Signed)
 Pt fell last week and continues to have pain on RT side of body- RT knee, hip and lower back

## 2024-01-06 NOTE — Discharge Instructions (Signed)

## 2024-01-06 NOTE — ED Provider Notes (Signed)
 Emergency Department Provider Note   I have reviewed the triage vital signs and the nursing notes.   HISTORY  Chief Complaint Fall   HPI Sharon Lamb is a 63 y.o. female presents to the emergency department for evaluation after fall.  Patient had a mechanical fall last week and has had continued pain to the right side of her body including the right knee, hip, lower back.  No numbness or weakness.  She is ambulatory but with some discomfort.  No head injury or loss of consciousness.  No presyncope symptoms prior to fall.   Past Medical History:  Diagnosis Date   Arthritis    Fibromyalgia    GERD (gastroesophageal reflux disease)    History of degenerative disc disease    Hypercholesteremia    Kidney stone    Migraine     Review of Systems  Constitutional: No fever/chills Cardiovascular: Denies chest pain. Respiratory: Denies shortness of breath. Gastrointestinal: No abdominal pain.  No nausea, no vomiting.  No diarrhea.  No constipation. Genitourinary: Negative for dysuria. Musculoskeletal: Positive low back pain, right knee pain.  Skin: Negative for rash. Neurological: Negative for headaches, focal weakness or numbness.  ____________________________________________   PHYSICAL EXAM:  VITAL SIGNS: ED Triage Vitals  Encounter Vitals Group     BP 01/06/24 1631 108/70     Pulse Rate 01/06/24 1631 63     Resp 01/06/24 1631 18     Temp 01/06/24 1631 97.7 F (36.5 C)     Temp src --      SpO2 01/06/24 1631 99 %     Weight 01/06/24 1627 230 lb (104.3 kg)     Height 01/06/24 1627 5\' 6"  (1.676 m)   Constitutional: Alert and oriented. Well appearing and in no acute distress. Eyes: Conjunctivae are normal.  Head: Atraumatic. Nose: No congestion/rhinnorhea. Mouth/Throat: Mucous membranes are moist.   Neck: No stridor.  Cardiovascular: Normal rate, regular rhythm. Good peripheral circulation. Grossly normal heart sounds.   Respiratory: Normal respiratory  effort.  No retractions. Lungs CTAB. Gastrointestinal: Soft and nontender. No distention.  Musculoskeletal: Patient with normal range of motion of the right knee, hip, ankle.  No abdomen or chest wall tenderness. Neurologic:  Normal speech and language. No gross focal neurologic deficits are appreciated.  Skin:  Skin is warm, dry and intact. No rash noted.  ____________________________________________  RADIOLOGY  DG Knee Complete 4 Views Right Result Date: 01/06/2024 CLINICAL DATA:  Right knee pain after fall last week. EXAM: RIGHT KNEE - COMPLETE 4+ VIEW COMPARISON:  None Available. FINDINGS: No evidence of fracture, dislocation, or joint effusion. Minimal narrowing and osteophyte formation is seen involving medial joint space. Soft tissues are unremarkable. IMPRESSION: Minimal degenerative joint disease is noted medially. No acute abnormality seen. Electronically Signed   By: Lupita Raider M.D.   On: 01/06/2024 18:29   DG Lumbar Spine Complete Result Date: 01/06/2024 CLINICAL DATA:  Lower back pain after fall last week. EXAM: LUMBAR SPINE - COMPLETE 4+ VIEW COMPARISON:  None Available. FINDINGS: There is no evidence of lumbar spine fracture. Alignment is normal. Intervertebral disc spaces are maintained. Minimal anterior osteophyte formation is noted at L2-3 and L3-4. IMPRESSION: No acute abnormality seen. Electronically Signed   By: Lupita Raider M.D.   On: 01/06/2024 18:27   DG Hip Unilat W or Wo Pelvis 2-3 Views Right Result Date: 01/06/2024 CLINICAL DATA:  Right hip pain after fall last week. EXAM: DG HIP (WITH OR WITHOUT PELVIS) 2-3V RIGHT  COMPARISON:  None Available. FINDINGS: There is no evidence of hip fracture or dislocation. Mild osteophyte formation of the right hip is noted without joint space narrowing. IMPRESSION: Mild degenerative joint disease of right hip. No acute abnormality seen. Electronically Signed   By: Lupita Raider M.D.   On: 01/06/2024 18:25     ____________________________________________   PROCEDURES  Procedure(s) performed:   Procedures  None  ____________________________________________   INITIAL IMPRESSION / ASSESSMENT AND PLAN / ED COURSE  Pertinent labs & imaging results that were available during my care of the patient were reviewed by me and considered in my medical decision making (see chart for details).   This patient is Presenting for Evaluation of knee/hip pain, which does require a range of treatment options, and is a complaint that involves a moderate risk of morbidity and mortality.  The Differential Diagnoses include fracture, dislocation, sprain, contusion, etc.  Radiologic Tests Ordered, included knee and hip XR. I independently interpreted the images and agree with radiology interpretation.   Medical Decision Making: Summary:  Patient presents with residual pain after mechanical fall.  Normal range of motion of all extremities.  Patient is ambulatory. Plan for symptom mgmt and ortho follow up.   Patient's presentation is most consistent with acute, uncomplicated illness.   Disposition: discharge  ____________________________________________  FINAL CLINICAL IMPRESSION(S) / ED DIAGNOSES  Final diagnoses:  Fall, initial encounter  Right leg pain  Strain of lumbar region, initial encounter     Note:  This document was prepared using Dragon voice recognition software and may include unintentional dictation errors.  Alona Bene, MD, [facility] Emergency Medicine    Chaniece Barbato, Arlyss Repress, MD 01/08/24 [phone]

## 2024-02-15 ENCOUNTER — Emergency Department (HOSPITAL_BASED_OUTPATIENT_CLINIC_OR_DEPARTMENT_OTHER)

## 2024-02-15 ENCOUNTER — Other Ambulatory Visit: Payer: Self-pay

## 2024-02-15 ENCOUNTER — Encounter (HOSPITAL_BASED_OUTPATIENT_CLINIC_OR_DEPARTMENT_OTHER): Payer: Self-pay | Admitting: Emergency Medicine

## 2024-02-15 ENCOUNTER — Emergency Department (HOSPITAL_BASED_OUTPATIENT_CLINIC_OR_DEPARTMENT_OTHER)
Admission: EM | Admit: 2024-02-15 | Discharge: 2024-02-15 | Disposition: A | Discharge status: Home / Self Care | Attending: Emergency Medicine | Admitting: Emergency Medicine

## 2024-02-15 DIAGNOSIS — R1031 Right lower quadrant pain: Secondary | ICD-10-CM | POA: Diagnosis not present

## 2024-02-15 DIAGNOSIS — R109 Unspecified abdominal pain: Secondary | ICD-10-CM

## 2024-02-15 DIAGNOSIS — M545 Low back pain, unspecified: Secondary | ICD-10-CM | POA: Insufficient documentation

## 2024-02-15 LAB — CBC WITH DIFFERENTIAL/PLATELET
Abs Immature Granulocytes: 0.01 10*3/uL (ref 0.00–0.07)
Basophils Absolute: 0 10*3/uL (ref 0.0–0.1)
Basophils Relative: 1 %
Eosinophils Absolute: 0.1 10*3/uL (ref 0.0–0.5)
Eosinophils Relative: 1 %
HCT: 36.9 % (ref 36.0–46.0)
Hemoglobin: 11.9 g/dL — ABNORMAL LOW (ref 12.0–15.0)
Immature Granulocytes: 0 %
Lymphocytes Relative: 42 %
Lymphs Abs: 1.9 10*3/uL (ref 0.7–4.0)
MCH: 28.2 pg (ref 26.0–34.0)
MCHC: 32.2 g/dL (ref 30.0–36.0)
MCV: 87.4 fL (ref 80.0–100.0)
Monocytes Absolute: 0.3 10*3/uL (ref 0.1–1.0)
Monocytes Relative: 7 %
Neutro Abs: 2.2 10*3/uL (ref 1.7–7.7)
Neutrophils Relative %: 49 %
Platelets: 280 10*3/uL (ref 150–400)
RBC: 4.22 MIL/uL (ref 3.87–5.11)
RDW: 13.3 % (ref 11.5–15.5)
WBC: 4.5 10*3/uL (ref 4.0–10.5)
nRBC: 0 % (ref 0.0–0.2)

## 2024-02-15 LAB — COMPREHENSIVE METABOLIC PANEL WITH GFR
ALT: 14 U/L (ref 0–44)
AST: 21 U/L (ref 15–41)
Albumin: 4.1 g/dL (ref 3.5–5.0)
Alkaline Phosphatase: 90 U/L (ref 38–126)
Anion gap: 10 (ref 5–15)
BUN: 8 mg/dL (ref 8–23)
CO2: 25 mmol/L (ref 22–32)
Calcium: 9.1 mg/dL (ref 8.9–10.3)
Chloride: 103 mmol/L (ref 98–111)
Creatinine, Ser: 0.88 mg/dL (ref 0.44–1.00)
GFR, Estimated: >60 mL/min (ref >60)
Glucose, Bld: 88 mg/dL (ref 70–99)
Potassium: 3.7 mmol/L (ref 3.5–5.1)
Sodium: 138 mmol/L (ref 135–145)
Total Bilirubin: 0.8 mg/dL (ref 0.0–1.2)
Total Protein: 7.3 g/dL (ref 6.5–8.1)

## 2024-02-15 LAB — URINALYSIS, ROUTINE W REFLEX MICROSCOPIC
Bilirubin Urine: NEGATIVE
Glucose, UA: NEGATIVE mg/dL
Hgb urine dipstick: NEGATIVE
Ketones, ur: NEGATIVE mg/dL
Leukocytes,Ua: NEGATIVE
Nitrite: NEGATIVE
Protein, ur: NEGATIVE mg/dL
Specific Gravity, Urine: 1.02 (ref 1.005–1.030)
pH: 7 (ref 5.0–8.0)

## 2024-02-15 MED ORDER — PREDNISONE 20 MG PO TABS: 40.0000 mg | ORAL_TABLET | Freq: Every day | ORAL | 0 refills | Status: AC

## 2024-02-15 MED ORDER — KETOROLAC TROMETHAMINE 15 MG/ML IJ SOLN
15.0000 mg | Freq: Once | INTRAMUSCULAR | Status: AC
  Administered 2024-02-15: 15 mg via INTRAVENOUS
  Filled 2024-02-15: qty 1

## 2024-02-15 NOTE — ED Provider Notes (Addendum)
 [place] [facility] AT [facility] Provider Note   CSN: [id number] Arrival date & time: 02/15/24  1010     History  Chief Complaint  Patient presents with   Back Pain    Sharon Lamb is a 63 y.o. female.  Patient with history of cholecystectomy, kidney stones requiring lithotripsy, chronic back pain due to degenerative disc disease --presents to the emergency department for evaluation of back pain and dysuria.  Patient states that she began having pain in her right lower back about 1 week ago.  Pain is gradually progressed.  2 or 3 days after starting with the symptoms, she developed dysuria.  No hematuria, increased frequency or urgency.  Pain radiates from the lower back around the lower abdomen into the groin.  This feels different than her usual back pain.  Patient reports a fall about 6 weeks ago, however she improved from that and this feels different.  She has had nausea without vomiting.  She has had subjective chills and some sweats but no documented fevers.  No chest pain or shortness of breath.  She has tried home pain medication and muscle relaxer without much improvement.       Home Medications Prior to Admission medications   Medication Sig Start Date End Date Taking? Authorizing Provider  celecoxib (CELEBREX) 200 MG capsule  12/28/14   [provider]  Diclofenac  Sodium CR 100 MG 24 hr tablet Take 1 tablet (100 mg total) by mouth daily. 06/28/19   Palumbo, April, MD  DULoxetine (CYMBALTA) 20 MG capsule Take 20 mg by mouth daily.    [provider]  esomeprazole (NEXIUM) 40 MG capsule TAKE ONE CAPSULE BY MOUTH TWICE DAILY 30 MINUTES BEFORE BREAKFAST AND DINNER 01/01/15   [provider]  estradiol (ESTRACE) 2 MG tablet Take 2 mg by mouth daily. TAKE 1 TABLET BY MOUTH DAILY FOR MENOPAUSE** STOP THE 1 MG TABLET** 12/04/14 08/23/23  [provider]  fluconazole  (DIFLUCAN ) 150 MG tablet Take 1 tablet (150 mg total) by  mouth daily. 06/20/23   Debbra Fairy, PA-C  fluticasone  (FLONASE ) 50 MCG/ACT nasal spray Place 2 sprays into both nostrils daily. 11/09/14   Ward, Clover Dao, DO  ibuprofen  (ADVIL ) 600 MG tablet Take 1 tablet (600 mg total) by mouth every 6 (six) hours as needed. 06/20/23   Debbra Fairy, PA-C  montelukast (SINGULAIR) 10 MG tablet Take 10 mg by mouth at bedtime.    [provider]  ondansetron  (ZOFRAN  ODT) 4 MG disintegrating tablet Take 1 tablet (4 mg total) by mouth every 8 (eight) hours as needed for nausea or vomiting. 01/24/21   Glauber, Hebert Littler, MD  oxyCODONE -acetaminophen  (PERCOCET) 10-325 MG tablet Take 1 tablet by mouth 4 (four) times daily as needed for severe pain 09/23/23     oxyCODONE -acetaminophen  (PERCOCET) 5-325 MG tablet Take 1 tablet by mouth every 6 (six) hours as needed. 06/24/19   Kehrli, Kelsey F, PA-C  potassium chloride  SA (K-DUR,KLOR-CON ) 20 MEQ tablet Take 1 tablet (20 mEq total) by mouth daily. 01/30/18   Steinl, Kevin, MD  simvastatin (ZOCOR) 40 MG tablet  12/28/14   [provider]  tamsulosin  (FLOMAX ) 0.4 MG CAPS capsule Take 1 capsule (0.4 mg total) by mouth daily. 06/24/19   Kehrli, Kelsey F, PA-C  tiZANidine (ZANAFLEX) 2 MG tablet Take 2 mg by mouth 2 (two) times daily.    [provider]  traZODone (DESYREL) 100 MG tablet Take 100 mg by mouth at bedtime.  [provider]  Vitamin D, Ergocalciferol, (DRISDOL) 50000 units CAPS capsule Take 50,000 Units by mouth every 7 (seven) days.    [provider]      Allergies    Tetracyclines & related and Fentanyl    Review of Systems   Review of Systems  Physical Exam Updated Vital Signs BP 132/65 (BP Location: Right Arm)   Pulse (!) 47   Temp 98.5 F (36.9 C) (Oral)   Resp 16   Ht 5\' 6"  (1.676 m)   Wt 105.7 kg   SpO2 99%   BMI 37.61 kg/m  Physical Exam Vitals and nursing note reviewed.  Constitutional:      General: She is not in acute distress.    Appearance: She is  well-developed.  HENT:     Head: Normocephalic and atraumatic.     Right Ear: External ear normal.     Left Ear: External ear normal.     Nose: Nose normal.  Eyes:     Conjunctiva/sclera: Conjunctivae normal.  Cardiovascular:     Rate and Rhythm: Normal rate and regular rhythm.     Heart sounds: No murmur heard. Pulmonary:     Effort: No respiratory distress.     Breath sounds: No wheezing, rhonchi or rales.  Abdominal:     Palpations: Abdomen is soft.     Tenderness: There is no abdominal tenderness. There is no guarding or rebound.  Musculoskeletal:     Cervical back: Normal range of motion and neck supple. No tenderness or bony tenderness.     Thoracic back: No tenderness or bony tenderness.     Lumbar back: Tenderness present. No bony tenderness.       Back:     Right lower leg: No edema.     Left lower leg: No edema.  Skin:    General: Skin is warm and dry.     Findings: No rash.  Neurological:     General: No focal deficit present.     Mental Status: She is alert. Mental status is at baseline.     Motor: No weakness.  Psychiatric:        Mood and Affect: Mood normal.     ED Results / Procedures / Treatments   Labs (all labs ordered are listed, but only abnormal results are displayed) Labs Reviewed  CBC WITH DIFFERENTIAL/PLATELET - Abnormal; Notable for the following components:      Result Value   Hemoglobin 11.9 (*)    All other components within normal limits  URINALYSIS, ROUTINE W REFLEX MICROSCOPIC  COMPREHENSIVE METABOLIC PANEL WITH GFR    EKG None  Radiology CT Renal Stone Study Result Date: 02/15/2024 CLINICAL DATA:  Right flank pain. EXAM: CT ABDOMEN AND PELVIS WITHOUT CONTRAST TECHNIQUE: Multidetector CT imaging of the abdomen and pelvis was performed following the standard protocol without IV contrast. RADIATION DOSE REDUCTION: This exam was performed according to the departmental dose-optimization program which includes automated exposure control,  adjustment of the mA and/or kV according to patient size and/or use of iterative reconstruction technique. COMPARISON:  11/19/2021 FINDINGS: Lower chest: Linear atelectasis/scarring in the lingula. Hepatobiliary: No suspicious focal hepatic lesion. Status post cholecystectomy. Similar mild prominence of the common bile duct likely relates to post cholecystectomy state. Pancreas: Unremarkable. No pancreatic ductal dilatation or surrounding inflammatory changes. Spleen: Normal in size without focal abnormality. Adrenals/Urinary Tract: Adrenal glands are unremarkable. Several nonobstructive right upper and right lower pole renal calculi measuring up to 4 mm. A few  nonobstructive left-sided renal calculi measuring up to 3 mm. No hydronephrosis. No ureteral calculi. Bladder is unremarkable. Stomach/Bowel: Postoperative changes in the stomach. Appendix appears normal. No evidence of bowel wall thickening, distention, or inflammatory changes. Vascular/Lymphatic: Abdominal aorta is normal in caliber without atherosclerotic calcification. No enlarged abdominal or pelvic lymph nodes. Reproductive: Status post hysterectomy. No adnexal masses. Other: No abdominal wall hernia or abnormality. No abdominopelvic ascites. No free air. Musculoskeletal: No acute osseous abnormality. Mild-to-moderate degenerative changes of the bilateral hips. IMPRESSION: 1. Several nonobstructive right renal calculi measuring up to 4 mm. No hydronephrosis. 2. Nonobstructive left renal calculi measuring up to 3 mm. 3.  Aortic Atherosclerosis (ICD10-I70.0). Electronically Signed   By: Mannie Seek M.D.   On: 02/15/2024 11:21    Procedures Procedures    Medications Ordered in ED Medications - No data to display  ED Course/ Medical Decision Making/ A&P    Patient seen and examined. History obtained directly from patient.   Labs/EKG: Ordered CBC, CMP, UA.  Imaging: Ordered CT renal protocol.  Medications/Fluids: None ordered  Most  recent vital signs reviewed and are as follows: BP 132/65 (BP Location: Right Arm)   Pulse (!) 47   Temp 98.5 F (36.9 C) (Oral)   Resp 16   Ht 5\' 6"  (1.676 m)   Wt 105.7 kg   SpO2 99%   BMI 37.61 kg/m   Initial impression: Right lower back pain with radiation to the groin.  Differential includes musculoskeletal pain, lumbar radiculopathy, ureteral colic, UTI/pyelonephritis.  2:44 PM Reassessment performed. Patient appears stable.  Patient is upset in regards to continued waiting for lab work.  Initially there was a long delay as the lab equipment was not working.  After this was rectified, CMP that we are waiting on, hemolyzed, prompting redraw.  No chemistry result now 4:35 into patient encounter. I apologized for the extended wait.  Patient will like to leave the emergency department at this time.  I offered to let the patient know if any concerning results return, and she agrees.  Labs personally reviewed and interpreted including: CBC with normal white blood cell count, minimally low hemoglobin 11.9 otherwise unremarkable; UA without signs of infection.  Imaging personally reviewed and interpreted including: CT renal protocol, gree no ureteral stone, no signs of intra-abdominal inflammation.  Nonobstructive renal stones noted.  Reviewed pertinent lab work and imaging with patient at bedside. Questions answered.   Most current vital signs reviewed and are as follows: BP 136/64   Pulse (!) 54   Temp 98.5 F (36.9 C)   Resp 18   Ht 5\' 6"  (1.676 m)   Wt 105.7 kg   SpO2 100%   BMI 37.61 kg/m   Plan: Discharge to home.   Prescriptions written for: Prednisone  x 5 days  Other home care instructions discussed: Maintain good hydration  ED return instructions discussed: The patient was urged to return to the Emergency Department immediately with worsening of current symptoms, worsening abdominal pain, persistent vomiting, blood noted in stools, fever, or any other concerns. The  patient verbalized understanding.   Follow-up instructions discussed: Patient encouraged to follow-up with their PCP in 3-5 days for recheck.  2:51 PM CMP is normal. No findings that will require follow-up.                                  Medical Decision Making Amount and/or Complexity of Data Reviewed Labs: ordered.  Radiology: ordered.  Risk Prescription drug management.   For this patient's complaint of flank pain, the following conditions were considered on the differential diagnosis: gastritis/PUD, enteritis/duodenitis, appendicitis, cholelithiasis/cholecystitis, cholangitis, pancreatitis, ruptured viscus, colitis, diverticulitis, small/large bowel obstruction, proctitis, cystitis, pyelonephritis, ureteral colic, aortic dissection, aortic aneurysm. In women, pelvic inflammatory disease, ovarian cysts, and tubo-ovarian abscess were also considered. Atypical chest etiologies were also considered including ACS, PE, and pneumonia.  Fortunately CT imaging did not show any signs of ureteral stones, pyelonephritis, concerning bony changes.    At this point, unclear cause.  Patient does have some degenerative disease in her back.  Will give prednisone  in case of this pain represents radicular type pain.  Otherwise she will need outpatient follow-up.  She is already prescribed oxycodone  and a muscle relaxer.  The patient's vital signs, pertinent lab work and imaging were reviewed and interpreted as discussed in the ED course. Hospitalization was considered for further testing, treatments, or serial exams/observation. However as patient is well-appearing, has a stable exam, and reassuring studies today, I do not feel that they warrant admission at this time. This plan was discussed with the patient who verbalizes agreement and comfort with this plan and seems reliable and able to return to the Emergency Department with worsening or changing symptoms.           Final Clinical  Impression(s) / ED Diagnoses Final diagnoses:  Acute right-sided low back pain without sciatica  Right flank pain    Rx / DC Orders ED Discharge Orders          Ordered    predniSONE  (DELTASONE ) 20 MG tablet  Daily        02/15/24 1442              Lyna Sandhoff, PA-C 02/15/24 1448    Lyna Sandhoff, PA-C 02/15/24 1452    Albertus Hughs, DO 02/15/24 1515

## 2024-02-15 NOTE — Discharge Instructions (Signed)
 Please read and follow all provided instructions.  Your diagnoses today include:  1. Acute right-sided low back pain without sciatica   2. Right flank pain     Tests performed today include: Complete blood cell count: Were normal Complete metabolic panel: has not yet resulted, I will let you know if you have any concerning results Urinalysis (urine test): Does not show sign of infection Vital signs. See below for your results today.   Medications prescribed:  Prednisone  - steroid medicine   It is best to take this medication in the morning to prevent sleeping problems. If you are diabetic, monitor your blood sugar closely and stop taking Prednisone  if blood sugar is over 300. Take with food to prevent stomach upset.   Take any prescribed medications only as directed.  Home care instructions:  Follow any educational materials contained in this packet.  Follow-up instructions: Please follow-up with your primary care provider in the next 3-5 days for further evaluation of your symptoms.    Return instructions:  SEEK IMMEDIATE MEDICAL ATTENTION IF: The pain does not go away or becomes severe  A temperature above 101F develops  Repeated vomiting occurs (multiple episodes)  The pain becomes localized to portions of the abdomen. The right side could possibly be appendicitis. In an adult, the left lower portion of the abdomen could be colitis or diverticulitis.  Blood is being passed in stools or vomit (bright red or black tarry stools)  You develop chest pain, difficulty breathing, dizziness or fainting, or become confused, poorly responsive, or inconsolable (young children) If you have any other emergent concerns regarding your health  Additional Information: Abdominal (belly) pain can be caused by many things. Your caregiver performed an examination and possibly ordered blood/urine tests and imaging (CT scan, x-rays, ultrasound). Many cases can be observed and treated at home after  initial evaluation in the emergency department. Even though you are being discharged home, abdominal pain can be unpredictable. Therefore, you need a repeated exam if your pain does not resolve, returns, or worsens. Most patients with abdominal pain don't have to be admitted to the hospital or have surgery, but serious problems like appendicitis and gallbladder attacks can start out as nonspecific pain. Many abdominal conditions cannot be diagnosed in one visit, so follow-up evaluations are very important.  Your vital signs today were: BP 136/64   Pulse (!) 54   Temp 98.5 F (36.9 C)   Resp 18   Ht 5\' 6"  (1.676 m)   Wt 105.7 kg   SpO2 100%   BMI 37.61 kg/m  If your blood pressure (bp) was elevated above 135/85 this visit, please have this repeated by your doctor within one month. --------------

## 2024-02-15 NOTE — ED Triage Notes (Signed)
 Rt back pain that rads to groin has hx of kidney stone and it hurts to pee she states x 1 week
